# Patient Record
Sex: Female | Born: 1975 | Race: Black or African American | Hispanic: No | Marital: Single | Smoking: Never smoker
Health system: Southern US, Community
[De-identification: ages and names within clinical notes are randomized; demographics above are authoritative.]

## PROBLEM LIST (undated history)

## (undated) DIAGNOSIS — Z5189 Encounter for other specified aftercare: Secondary | ICD-10-CM

## (undated) DIAGNOSIS — D649 Anemia, unspecified: Secondary | ICD-10-CM

## (undated) DIAGNOSIS — J45909 Unspecified asthma, uncomplicated: Secondary | ICD-10-CM

## (undated) HISTORY — PX: NO PAST SURGERIES: SHX2092

---

## 2005-03-06 ENCOUNTER — Emergency Department (HOSPITAL_COMMUNITY): Admission: EM | Admit: 2005-03-06 | Discharge: 2005-03-07 | Payer: Self-pay | Admitting: Emergency Medicine

## 2005-03-11 ENCOUNTER — Emergency Department (HOSPITAL_COMMUNITY): Admission: EM | Admit: 2005-03-11 | Discharge: 2005-03-12 | Payer: Self-pay | Admitting: Emergency Medicine

## 2005-11-11 ENCOUNTER — Emergency Department (HOSPITAL_COMMUNITY): Admission: EM | Admit: 2005-11-11 | Discharge: 2005-11-11 | Payer: Self-pay | Admitting: Emergency Medicine

## 2006-01-29 ENCOUNTER — Emergency Department (HOSPITAL_COMMUNITY): Admission: EM | Admit: 2006-01-29 | Discharge: 2006-01-29 | Payer: Self-pay | Admitting: Emergency Medicine

## 2006-07-09 ENCOUNTER — Emergency Department (HOSPITAL_COMMUNITY): Admission: EM | Admit: 2006-07-09 | Discharge: 2006-07-09 | Payer: Self-pay | Admitting: Emergency Medicine

## 2006-08-06 ENCOUNTER — Ambulatory Visit: Payer: Self-pay | Admitting: Internal Medicine

## 2006-11-12 ENCOUNTER — Emergency Department (HOSPITAL_COMMUNITY): Admission: EM | Admit: 2006-11-12 | Discharge: 2006-11-12 | Payer: Self-pay | Admitting: Emergency Medicine

## 2006-11-13 ENCOUNTER — Ambulatory Visit: Payer: Self-pay | Admitting: *Deleted

## 2007-05-06 ENCOUNTER — Emergency Department (HOSPITAL_COMMUNITY): Admission: EM | Admit: 2007-05-06 | Discharge: 2007-05-06 | Payer: Self-pay | Admitting: Emergency Medicine

## 2007-09-13 ENCOUNTER — Emergency Department (HOSPITAL_COMMUNITY): Admission: EM | Admit: 2007-09-13 | Discharge: 2007-09-13 | Payer: Self-pay | Admitting: Emergency Medicine

## 2007-10-21 ENCOUNTER — Emergency Department (HOSPITAL_COMMUNITY): Admission: EM | Admit: 2007-10-21 | Discharge: 2007-10-22 | Payer: Self-pay | Admitting: Emergency Medicine

## 2011-01-03 ENCOUNTER — Emergency Department (HOSPITAL_COMMUNITY)
Admission: EM | Admit: 2011-01-03 | Discharge: 2011-01-03 | Disposition: A | Discharge status: Home / Self Care | Payer: Worker's Compensation | Attending: Emergency Medicine | Admitting: Emergency Medicine

## 2011-01-03 DIAGNOSIS — R51 Headache: Secondary | ICD-10-CM | POA: Insufficient documentation

## 2011-01-03 DIAGNOSIS — H538 Other visual disturbances: Secondary | ICD-10-CM | POA: Insufficient documentation

## 2011-01-03 DIAGNOSIS — Y990 Civilian activity done for income or pay: Secondary | ICD-10-CM | POA: Insufficient documentation

## 2011-01-03 DIAGNOSIS — S0990XA Unspecified injury of head, initial encounter: Secondary | ICD-10-CM | POA: Insufficient documentation

## 2011-01-03 DIAGNOSIS — IMO0002 Reserved for concepts with insufficient information to code with codable children: Secondary | ICD-10-CM | POA: Insufficient documentation

## 2011-01-03 DIAGNOSIS — R42 Dizziness and giddiness: Secondary | ICD-10-CM | POA: Insufficient documentation

## 2011-01-18 ENCOUNTER — Other Ambulatory Visit: Payer: Self-pay | Admitting: Internal Medicine

## 2011-01-18 DIAGNOSIS — T1490XA Injury, unspecified, initial encounter: Secondary | ICD-10-CM

## 2011-01-22 ENCOUNTER — Other Ambulatory Visit: Payer: Worker's Compensation

## 2012-04-26 ENCOUNTER — Emergency Department (HOSPITAL_COMMUNITY): Payer: Self-pay

## 2012-04-26 ENCOUNTER — Encounter (HOSPITAL_COMMUNITY): Payer: Self-pay | Admitting: *Deleted

## 2012-04-26 ENCOUNTER — Emergency Department (HOSPITAL_COMMUNITY)
Admission: EM | Admit: 2012-04-26 | Discharge: 2012-04-27 | Disposition: A | Discharge status: Home / Self Care | Payer: Self-pay | Attending: Emergency Medicine | Admitting: Emergency Medicine

## 2012-04-26 DIAGNOSIS — J45901 Unspecified asthma with (acute) exacerbation: Secondary | ICD-10-CM | POA: Insufficient documentation

## 2012-04-26 DIAGNOSIS — R0602 Shortness of breath: Secondary | ICD-10-CM | POA: Insufficient documentation

## 2012-04-26 DIAGNOSIS — R059 Cough, unspecified: Secondary | ICD-10-CM | POA: Insufficient documentation

## 2012-04-26 DIAGNOSIS — R05 Cough: Secondary | ICD-10-CM | POA: Insufficient documentation

## 2012-04-26 HISTORY — DX: Unspecified asthma, uncomplicated: J45.909

## 2012-04-26 MED ORDER — ALBUTEROL (5 MG/ML) CONTINUOUS INHALATION SOLN
10.0000 mg/h | INHALATION_SOLUTION | RESPIRATORY_TRACT | Status: AC
  Administered 2012-04-26: via RESPIRATORY_TRACT
  Administered 2012-04-26: 10 mg/h via RESPIRATORY_TRACT

## 2012-04-26 MED ORDER — ALBUTEROL SULFATE (5 MG/ML) 0.5% IN NEBU
INHALATION_SOLUTION | RESPIRATORY_TRACT | Status: AC
  Filled 2012-04-26: qty 2

## 2012-04-26 MED ORDER — IPRATROPIUM BROMIDE 0.02 % IN SOLN
0.5000 mg | Freq: Once | RESPIRATORY_TRACT | Status: AC
  Administered 2012-04-26: 0.5 mg via RESPIRATORY_TRACT
  Filled 2012-04-26: qty 2.5

## 2012-04-26 MED ORDER — PREDNISONE 20 MG PO TABS
60.0000 mg | ORAL_TABLET | Freq: Once | ORAL | Status: AC
  Administered 2012-04-26: 60 mg via ORAL
  Filled 2012-04-26: qty 3

## 2012-04-26 MED ORDER — ALBUTEROL SULFATE (5 MG/ML) 0.5% IN NEBU
5.0000 mg | INHALATION_SOLUTION | Freq: Once | RESPIRATORY_TRACT | Status: AC
  Administered 2012-04-26: 5 mg via RESPIRATORY_TRACT
  Filled 2012-04-26: qty 1

## 2012-04-26 NOTE — ED Provider Notes (Signed)
History     CSN: [id number]  Arrival date & time 04/26/12  2031   First MD Initiated Contact with Patient 04/26/12 2230      Chief Complaint  Patient presents with  . Asthma  . Cough  . Shortness of Breath  . Wheezing    (Consider location/radiation/quality/duration/timing/severity/associated sxs/prior treatment) HPI Comments: Patient is a 36 year old female with a past medical history of asthma who presents with a 5 day history of SOB. Patient reports gradual onset and progressive worsening of symptoms since the onset. Patient works outside and believes the weather and dust particles at work are triggering her asthma. No alleviating factors. Patient has tried robitussin and her friend's nebulizer which have not helped. Patient reports an asthma attack 1 month ago. Associated symptoms include wheezing and non productive cough.   Patient is a 36 y.o. female presenting with asthma, cough, shortness of breath, and wheezing.  Asthma Associated symptoms include coughing.  Cough Associated symptoms include shortness of breath and wheezing. Her past medical history is significant for asthma.  Shortness of Breath  Associated symptoms include cough, shortness of breath and wheezing. Her past medical history is significant for asthma.  Wheezing  Associated symptoms include cough, shortness of breath and wheezing. Her past medical history is significant for asthma.    Past Medical History  Diagnosis Date  . Asthma     History reviewed. No pertinent past surgical history.  No family history on file.  History  Substance Use Topics  . Smoking status: Never Smoker   . Smokeless tobacco: Not on file  . Alcohol Use: No    OB History    Grav Para Term Preterm Abortions TAB SAB Ect Mult Living                  Review of Systems  Respiratory: Positive for cough, shortness of breath and wheezing.   All other systems reviewed and are negative.    Allergies  Codeine  Home  Medications   Current Outpatient Rx  Name  Route  Sig  Dispense  Refill  . GUAIFENESIN 100 MG/5ML PO LIQD   Oral   Take 200 mg by mouth 3 (three) times daily as needed. For cough           BP 143/78  Pulse 102  Temp 98.9 F (37.2 C) (Oral)  Resp 20  SpO2 100%  LMP 03/18/2012  Physical Exam  Nursing note and vitals reviewed. Constitutional: She is oriented to person, place, and time. She appears well-developed and well-nourished. No distress.  HENT:  Head: Normocephalic and atraumatic.  Mouth/Throat: Oropharynx is clear and moist. No oropharyngeal exudate.  Eyes: Conjunctivae normal and EOM are normal. Pupils are equal, round, and reactive to light. No scleral icterus.  Neck: Normal range of motion. Neck supple.  Cardiovascular: Normal rate and regular rhythm.  Exam reveals no gallop and no friction rub.   No murmur heard. Pulmonary/Chest: Effort normal. She has wheezes. She has no rales. She exhibits no tenderness.       Wheezing noted throughout bilateral lung fields.   Abdominal: Soft. She exhibits no distension. There is no tenderness. There is no rebound and no guarding.  Musculoskeletal: Normal range of motion.  Neurological: She is alert and oriented to person, place, and time. Coordination normal.       Speech is goal-oriented. Moves limbs without ataxia.   Skin: Skin is warm and dry. She is not diaphoretic.  Psychiatric: She has  a normal mood and affect. Her behavior is normal.    ED Course  Procedures (including critical care time)  Labs Reviewed - No data to display Dg Chest 2 View  04/26/2012  *RADIOLOGY REPORT*  Clinical Data: Asthma, cough, wheezing and shortness of breath.  CHEST - 2 VIEW  Comparison: 05/06/2007  Findings: No edema, infiltrate or pleural fluid is seen.  Lung volumes are normal.  No pneumothorax identified.  The heart size is at the upper limits of normal and slightly more prominent compared to the prior chest x-ray in 2009.  No bony  abnormalities are identified.  IMPRESSION: Increased prominence of heart size since 2009.   Original Report Authenticated By: Irish Lack, M.D.      1. Asthma exacerbation       MDM  10:57 PM Patient will have prednisone, atrovent nebulizer and albuterol nebulizer. Chest xray unremarkable.   12:58 AM Patient feels better and breathing has improved. Patient will be discharged with prednisone, albuterol inhaler, and dextromethorphan. Patient will be discharged without further evaluation. Vitals stable.      Emilia Beck, PA-C 04/27/12 [phone]

## 2012-04-26 NOTE — ED Notes (Signed)
Notified respiratory of continuous neb

## 2012-04-26 NOTE — ED Notes (Addendum)
C/o sob, cough, wheezing, onset Monday. Mentions vomiting Wednesday. persistant constant cough in triage. Last attack ~ 1 month ago. Never hospitalized for same. Last prednisone and CXR was yrs ago. Works outside. Recent weather has triggered sx. Denies fever. Has borrowed breathing machine, has been using albuterol, also robitussin. Robitussin in am, both at night. Last albuterol was Friday night.

## 2012-04-27 ENCOUNTER — Encounter (HOSPITAL_COMMUNITY): Payer: Self-pay | Admitting: Emergency Medicine

## 2012-04-27 MED ORDER — DEXTROMETHORPHAN POLISTIREX 30 MG/5ML PO LQCR
10.0000 mL | Freq: Once | ORAL | Status: AC
  Administered 2012-04-27: 60 mg via ORAL
  Filled 2012-04-27: qty 10

## 2012-04-27 MED ORDER — DEXTROMETHORPHAN HBR 15 MG/5ML PO SYRP: 10.0000 mL | ORAL_SOLUTION | Freq: Four times a day (QID) | ORAL | Status: DC | PRN

## 2012-04-27 MED ORDER — PREDNISONE 20 MG PO TABS: 40.0000 mg | ORAL_TABLET | Freq: Every day | ORAL | Status: DC

## 2012-04-27 MED ORDER — ALBUTEROL SULFATE HFA 108 (90 BASE) MCG/ACT IN AERS: 1.0000 | INHALATION_SPRAY | Freq: Four times a day (QID) | RESPIRATORY_TRACT | Status: DC | PRN

## 2012-05-15 NOTE — ED Provider Notes (Signed)
Medical screening examination/treatment/procedure(s) were performed by non-physician practitioner and as supervising physician I was immediately available for consultation/collaboration.   Loren Racer, MD 05/15/12 [phone]

## 2013-02-24 ENCOUNTER — Emergency Department (HOSPITAL_COMMUNITY)
Admission: EM | Admit: 2013-02-24 | Discharge: 2013-02-24 | Discharge status: Against Medical Advice | Payer: PRIVATE HEALTH INSURANCE | Attending: Emergency Medicine | Admitting: Emergency Medicine

## 2013-02-24 ENCOUNTER — Encounter (HOSPITAL_COMMUNITY): Payer: Self-pay | Admitting: Emergency Medicine

## 2013-02-24 DIAGNOSIS — J45901 Unspecified asthma with (acute) exacerbation: Secondary | ICD-10-CM | POA: Insufficient documentation

## 2013-02-24 DIAGNOSIS — Z3202 Encounter for pregnancy test, result negative: Secondary | ICD-10-CM | POA: Insufficient documentation

## 2013-02-24 LAB — URINALYSIS, ROUTINE W REFLEX MICROSCOPIC
Hgb urine dipstick: NEGATIVE
Specific Gravity, Urine: 1.023 (ref 1.005–1.030)
Urobilinogen, UA: 1 mg/dL (ref 0.0–1.0)

## 2013-02-24 LAB — URINE MICROSCOPIC-ADD ON

## 2013-02-24 LAB — POCT PREGNANCY, URINE: Preg Test, Ur: NEGATIVE

## 2013-02-24 NOTE — ED Notes (Signed)
Presents with nausea, frequent urination and lack of period since February. Denies abdominal pain , denies pain anywhere at this time.  Pt states, "I am just tired and I just want to go sleep all the time, I feel movement in my belly and I have not had a period. I think I am pregnant"

## 2013-02-24 NOTE — ED Notes (Signed)
No answer, unable to find. 

## 2013-02-24 NOTE — ED Notes (Signed)
Wait explained, reassured, hope given. General announcement to w/r.

## 2013-02-24 NOTE — ED Notes (Signed)
Wait plan and process explained with rationale.

## 2013-02-24 NOTE — ED Notes (Signed)
Wait, plan and process explained with rationale.

## 2013-02-24 NOTE — ED Notes (Signed)
Attempted to call x2 for pt being moved to room.

## 2013-02-27 LAB — URINE CULTURE

## 2013-02-28 NOTE — Progress Notes (Signed)
ED Antimicrobial Stewardship Positive Culture Follow Up   Giamarie Bueche is an 37 y.o. female who presented to [facility] on 02/24/2013 with a chief complaint of nausea, frequent urination  Chief Complaint  Patient presents with  . Possible Pregnancy    Recent Results (from the past 720 hour(s))  URINE CULTURE     Status: None   Collection Time    02/24/13  6:12 PM      Result Value Range Status   Specimen Description URINE, CLEAN CATCH   Final   Special Requests NONE   Final   Culture  Setup Time     Final   Value: 02/24/2013 20:48     Performed at [facility] Count     Final   Value: >=100,000 COLONIES/ML     Performed at [facility]   Culture     Final   Value: ESCHERICHIA COLI     Performed at [facility]   Report Status 02/27/2013 FINAL   Final   Organism ID, Bacteria ESCHERICHIA COLI   Final    [x]  Patient discharged originally without antimicrobial agent and treatment is now indicated  New antibiotic prescription: Amoxicillin 500 mg bid x 10 days  ED Provider: Elpidio Anis, PA-C  Rolley Sims 02/28/2013, 1:27 PM Infectious Diseases Pharmacist Phone# [phone]

## 2013-03-02 ENCOUNTER — Encounter (HOSPITAL_COMMUNITY): Payer: Self-pay | Admitting: Emergency Medicine

## 2013-03-02 ENCOUNTER — Emergency Department (HOSPITAL_COMMUNITY)
Admission: EM | Admit: 2013-03-02 | Discharge: 2013-03-02 | Disposition: A | Discharge status: Home / Self Care | Payer: PRIVATE HEALTH INSURANCE | Attending: Emergency Medicine | Admitting: Emergency Medicine

## 2013-03-02 DIAGNOSIS — J45909 Unspecified asthma, uncomplicated: Secondary | ICD-10-CM | POA: Insufficient documentation

## 2013-03-02 DIAGNOSIS — Z862 Personal history of diseases of the blood and blood-forming organs and certain disorders involving the immune mechanism: Secondary | ICD-10-CM | POA: Insufficient documentation

## 2013-03-02 DIAGNOSIS — Z79899 Other long term (current) drug therapy: Secondary | ICD-10-CM | POA: Insufficient documentation

## 2013-03-02 DIAGNOSIS — N390 Urinary tract infection, site not specified: Secondary | ICD-10-CM | POA: Insufficient documentation

## 2013-03-02 HISTORY — DX: Anemia, unspecified: D64.9

## 2013-03-02 LAB — URINE MICROSCOPIC-ADD ON

## 2013-03-02 LAB — URINALYSIS, ROUTINE W REFLEX MICROSCOPIC
Ketones, ur: NEGATIVE mg/dL
Leukocytes, UA: NEGATIVE
Nitrite: NEGATIVE
Protein, ur: NEGATIVE mg/dL
Urobilinogen, UA: 0.2 mg/dL (ref 0.0–1.0)

## 2013-03-02 MED ORDER — CEPHALEXIN 500 MG PO CAPS: 500.0000 mg | ORAL_CAPSULE | Freq: Four times a day (QID) | ORAL | Status: DC

## 2013-03-02 NOTE — ED Notes (Signed)
Attempt made to contact patient to notify her that needs to treated due urine culture coming back positive.Patient returned call and was notified to follow up with PCP.

## 2013-03-02 NOTE — ED Provider Notes (Signed)
Medical screening examination/treatment/procedure(s) were performed by non-physician practitioner and as supervising physician I was immediately available for consultation/collaboration.  EKG Interpretation   None         Richardean Canal, MD 03/02/13 1929

## 2013-03-02 NOTE — ED Notes (Signed)
Pt was here last week for pregnancy test and had to leave prior to MD eval. States she was called today and told to return for e coli in urine on UA.

## 2013-03-02 NOTE — ED Provider Notes (Signed)
CSN: [id number]     Arrival date & time 03/02/13  1650 History   First MD Initiated Contact with Patient 03/02/13 1809     Chief Complaint  Patient presents with  . Urinary Tract Infection   (Consider location/radiation/quality/duration/timing/severity/associated sxs/prior Treatment) HPI Comments: Patient is a 37 year old female who presents to the emergency department after being called back to return for treatment for a urinary tract infection. Patient states she received a call today to return as her urine culture from October 28 showed Escherichia coli. She had frequent urination at that time, left prior to being seen. Urine pregnancy resulted negative. Currently she is denying any complaints. Denies abdominal pain, increased urinary frequency, urgency or dysuria. Currently on her menses.  The history is provided by the patient and medical records.    Past Medical History  Diagnosis Date  . Asthma   . Anemia    History reviewed. No pertinent past surgical history. History reviewed. No pertinent family history. History  Substance Use Topics  . Smoking status: Never Smoker   . Smokeless tobacco: Not on file  . Alcohol Use: No   OB History   Grav Para Term Preterm Abortions TAB SAB Ect Mult Living                 Review of Systems  All other systems reviewed and are negative.    Allergies  Codeine  Home Medications   Current Outpatient Rx  Name  Route  Sig  Dispense  Refill  . albuterol (PROVENTIL HFA;VENTOLIN HFA) 108 (90 BASE) MCG/ACT inhaler   Inhalation   Inhale 1-2 puffs into the lungs every 6 (six) hours as needed for wheezing.   1 Inhaler   3    BP 110/66  Pulse 103  Temp(Src) 98.8 F (37.1 C) (Oral)  Resp 18  SpO2 98%  LMP 06/27/2012 Physical Exam  Nursing note and vitals reviewed. Constitutional: She is oriented to person, place, and time. She appears well-developed and well-nourished. No distress.  HENT:  Head: Normocephalic and atraumatic.   Mouth/Throat: Oropharynx is clear and moist.  Eyes: Conjunctivae are normal.  Neck: Normal range of motion. Neck supple.  Cardiovascular: Normal rate, regular rhythm and normal heart sounds.   No tachycardia at time of examination.  Pulmonary/Chest: Effort normal and breath sounds normal.  Abdominal: Soft. Bowel sounds are normal. There is tenderness (mild) in the suprapubic area.  Musculoskeletal: Normal range of motion. She exhibits no edema.  Neurological: She is alert and oriented to person, place, and time.  Skin: Skin is warm and dry. She is not diaphoretic.  Psychiatric: She has a normal mood and affect. Her behavior is normal.    ED Course  Procedures (including critical care time) Labs Review Labs Reviewed  URINALYSIS, ROUTINE W REFLEX MICROSCOPIC - Abnormal; Notable for the following:    APPearance CLOUDY (*)    Hgb urine dipstick LARGE (*)    All other components within normal limits  URINE MICROSCOPIC-ADD ON - Abnormal; Notable for the following:    Squamous Epithelial / LPF FEW (*)    Bacteria, UA FEW (*)    All other components within normal limits  URINE CULTURE   Imaging Review No results found.  EKG Interpretation   None       MDM   1. Urinary tract infection    Tx with keflex. F/u with PCP. Return precautions given. Well appearing, NAD. Patient states understanding of treatment care plan and is agreeable.  Trevor Mace, PA-C 03/02/13 1925

## 2013-03-05 LAB — URINE CULTURE

## 2013-06-11 ENCOUNTER — Inpatient Hospital Stay (HOSPITAL_COMMUNITY)
Admission: EM | Admit: 2013-06-11 | Discharge: 2013-06-13 | DRG: 812 | Disposition: A | Discharge status: Home / Self Care | Payer: No Typology Code available for payment source | Attending: Internal Medicine | Admitting: Internal Medicine

## 2013-06-11 ENCOUNTER — Encounter (HOSPITAL_COMMUNITY): Payer: Self-pay | Admitting: Emergency Medicine

## 2013-06-11 DIAGNOSIS — D500 Iron deficiency anemia secondary to blood loss (chronic): Principal | ICD-10-CM

## 2013-06-11 DIAGNOSIS — D518 Other vitamin B12 deficiency anemias: Secondary | ICD-10-CM | POA: Diagnosis present

## 2013-06-11 DIAGNOSIS — N920 Excessive and frequent menstruation with regular cycle: Secondary | ICD-10-CM

## 2013-06-11 DIAGNOSIS — E876 Hypokalemia: Secondary | ICD-10-CM

## 2013-06-11 DIAGNOSIS — Z888 Allergy status to other drugs, medicaments and biological substances status: Secondary | ICD-10-CM

## 2013-06-11 DIAGNOSIS — N926 Irregular menstruation, unspecified: Secondary | ICD-10-CM | POA: Diagnosis present

## 2013-06-11 DIAGNOSIS — D649 Anemia, unspecified: Secondary | ICD-10-CM

## 2013-06-11 DIAGNOSIS — D519 Vitamin B12 deficiency anemia, unspecified: Secondary | ICD-10-CM

## 2013-06-11 DIAGNOSIS — D509 Iron deficiency anemia, unspecified: Secondary | ICD-10-CM

## 2013-06-11 DIAGNOSIS — J45909 Unspecified asthma, uncomplicated: Secondary | ICD-10-CM | POA: Diagnosis present

## 2013-06-11 DIAGNOSIS — Z885 Allergy status to narcotic agent status: Secondary | ICD-10-CM

## 2013-06-11 DIAGNOSIS — R609 Edema, unspecified: Secondary | ICD-10-CM | POA: Diagnosis present

## 2013-06-11 HISTORY — DX: Excessive and frequent menstruation with regular cycle: N92.0

## 2013-06-11 LAB — PROTIME-INR
INR: 1.01 (ref 0.00–1.49)
Prothrombin Time: 13.1 seconds (ref 11.6–15.2)

## 2013-06-11 LAB — COMPREHENSIVE METABOLIC PANEL
ALBUMIN: 3.6 g/dL (ref 3.5–5.2)
ALK PHOS: 71 U/L (ref 39–117)
ALT: 10 U/L (ref 0–35)
AST: 15 U/L (ref 0–37)
BUN: 10 mg/dL (ref 6–23)
CHLORIDE: 105 meq/L (ref 96–112)
CO2: 24 mEq/L (ref 19–32)
Calcium: 9.1 mg/dL (ref 8.4–10.5)
Creatinine, Ser: 0.55 mg/dL (ref 0.50–1.10)
GFR calc non Af Amer: >90 mL/min (ref >90)
GLUCOSE: 99 mg/dL (ref 70–99)
POTASSIUM: 3.5 meq/L — AB (ref 3.7–5.3)
SODIUM: 142 meq/L (ref 137–147)
TOTAL PROTEIN: 7.3 g/dL (ref 6.0–8.3)

## 2013-06-11 LAB — CBC WITH DIFFERENTIAL/PLATELET
BASOS ABS: 0 10*3/uL (ref 0.0–0.1)
Basophils Relative: 1 % (ref 0–1)
EOS PCT: 1 % (ref 0–5)
Eosinophils Absolute: 0 10*3/uL (ref 0.0–0.7)
HEMATOCRIT: 16.6 % — AB (ref 36.0–46.0)
HEMOGLOBIN: 4.3 g/dL — AB (ref 12.0–15.0)
LYMPHS PCT: 32 % (ref 12–46)
Lymphs Abs: 1.4 10*3/uL (ref 0.7–4.0)
MCH: 13.5 pg — ABNORMAL LOW (ref 26.0–34.0)
MCHC: 25.5 g/dL — ABNORMAL LOW (ref 30.0–36.0)
MCV: 53.1 fL — AB (ref 78.0–100.0)
MONOS PCT: 7 % (ref 3–12)
Monocytes Absolute: 0.3 10*3/uL (ref 0.1–1.0)
NEUTROS ABS: 2.7 10*3/uL (ref 1.7–7.7)
Neutrophils Relative %: 59 % (ref 43–77)
Platelets: 359 10*3/uL (ref 150–400)
RBC: 3.11 MIL/uL — AB (ref 3.87–5.11)
RDW: 28.4 % — AB (ref 11.5–15.5)
WBC: 4.4 10*3/uL (ref 4.0–10.5)

## 2013-06-11 LAB — ABO/RH: ABO/RH(D): B POS

## 2013-06-11 LAB — PREPARE RBC (CROSSMATCH)

## 2013-06-11 LAB — OCCULT BLOOD, POC DEVICE: FECAL OCCULT BLD: NEGATIVE

## 2013-06-11 MED ORDER — ACETAMINOPHEN 325 MG PO TABS: 650.0000 mg | ORAL_TABLET | Freq: Four times a day (QID) | ORAL | Status: DC | PRN

## 2013-06-11 MED ORDER — POTASSIUM CHLORIDE CRYS ER 20 MEQ PO TBCR
40.0000 meq | EXTENDED_RELEASE_TABLET | Freq: Once | ORAL | Status: AC
  Administered 2013-06-11: 40 meq via ORAL
  Filled 2013-06-11: qty 2

## 2013-06-11 MED ORDER — ACETAMINOPHEN 650 MG RE SUPP: 650.0000 mg | Freq: Four times a day (QID) | RECTAL | Status: DC | PRN

## 2013-06-11 NOTE — H&P (Signed)
[facility] History and Physical  Rebecca Payne HAL:[id number] DOB: 1975/11/28 DOA: 06/11/2013  Referring physician: Dr. Verner Chol PCP: Salena Saner., MD   Chief Complaint:  Sent from PCP office for low hemoglobin  HPI:  38 year old female with past medical history of anemia (was on iron supplements and stopped taking it about 2 years back) had gone to see her PCP for symptoms of leg swellings for 2 weeks. Patient also reports losing about 10-15 pounds for past 6 weeks which she is is intentional and has been eating poorly due to job stress. Patient went to see her PCP for this and had blood work done which showed low hemoglobin and patient was asked to come to the ED. Patient reports having heavy menstrual cycles over the years, the previous year she had longer menstrual cycles lasting almost for a week with blood clots. For the last 6 months her menstrual bleeding lasts for 4 days but are heavy with passage of clots and changes at least 3 pads in a day. Her leg swelling subsided a few days ago. Patient denies headache, dizziness, fever, chills, nausea , vomiting, chest pain, palpitations, SOB, abdominal pain, bowel or urinary symptoms. Denies any weakness, fatigue or dyspnea on exertion. Denies any blood in stool or change in consistency of the stool. Denies any hemoptysis or hematemesis. Denies change in appetite. She denies taking any medications. She does report history of anemia and being on iron supplements in the past but has not taken any for past 2 years.   Course in the ED Patient's vitals were stable. Blood work done toward RBC of 3.1, hemoglobin of 4.3 and hematocrit of 16.6. MCV was 53. Chemistry showed potassium of 2.5. Patient admitted under observation. 2 U PRBC ordered by ED physician.  Review of Systems:  Constitutional: Denies fever, chills, diaphoresis, appetite change and fatigue.  HEENT: Denies photophobia, ear pain, congestion, sore throat, rhinorrhea,  sneezing, mouth sores, trouble swallowing, neck pain, neck stiffness and tinnitus.   Respiratory: Denies SOB, DOE, cough, chest tightness,  and wheezing.   Cardiovascular: Denies chest pain, palpitations and leg swelling.  Gastrointestinal: Denies nausea, vomiting, abdominal pain, diarrhea, constipation, blood in stool and abdominal distention.  Genitourinary: Denies dysuria, urgency, frequency, hematuria, flank pain and difficulty urinating.  Endocrine: Denies  hot or cold intolerance, sweats, changes in hair or nails, polyuria, polydipsia. Musculoskeletal: Denies myalgias, back pain, joint swelling, arthralgias and gait problem.  Skin: Denies pallor, rash and wound.  Neurological: Denies dizziness, seizures, syncope, weakness, light-headedness, numbness and headaches.  Hematological: Denies adenopathy. Hx of bleeding Psychiatric/Behavioral: Denies  mood changes, confusion, nervousness, sleep disturbance and agitation   Past Medical History  Diagnosis Date  . Asthma   . Anemia    Past Surgical History  Procedure Laterality Date  . No past surgeries     Social History:  reports that she has never smoked. She has never used smokeless tobacco. She reports that she does not drink alcohol or use illicit drugs.  Allergies  Allergen Reactions  . Codeine Swelling  . Lasix [Furosemide] Nausea Only    "took with potassium and had an episode with her vision "    History reviewed. No pertinent family history.  Prior to Admission medications   Not on File     Physical Exam:  Filed Vitals:   06/11/13 1634 06/11/13 1806 06/11/13 1808  BP: 150/64 117/78   Pulse: 96  94  Temp: 98.2 F (36.8 C)    TempSrc: Oral  Resp: 18  18  SpO2: 100%  100%    Constitutional: Vital signs reviewed.  Patient is a middle aged female lying in bed in no acute distress  HEENT:pallor present, no icterus, moist oral mucosa, no cervical lymphadenopathy Cardiovascular: RRR, S1 normal, S2 normal, no  MRG Chest: CTAB, no wheezes, rales, or rhonchi Abdominal: Soft. Non-tender, non-distended, bowel sounds are normal, no masses, organomegaly, or guarding present.  Ext: warm, no edema Neurological: A&O x3, non focal  Labs on Admission:  Basic Metabolic Panel:  Recent Labs Lab 06/11/13 1711  NA 142  K 3.5*  CL 105  CO2 24  GLUCOSE 99  BUN 10  CREATININE 0.55  CALCIUM 9.1   Liver Function Tests:  Recent Labs Lab 06/11/13 1711  AST 15  ALT 10  ALKPHOS 71  BILITOT <0.2*  PROT 7.3  ALBUMIN 3.6   No results found for this basename: LIPASE, AMYLASE,  in the last 168 hours No results found for this basename: AMMONIA,  in the last 168 hours CBC:  Recent Labs Lab 06/11/13 1711  WBC 4.4  NEUTROABS PENDING  HGB 4.3*  HCT 16.6*  MCV 53.1*  PLT 359   Cardiac Enzymes: No results found for this basename: CKTOTAL, CKMB, CKMBINDEX, TROPONINI,  in the last 168 hours BNP: No components found with this basename: POCBNP,  CBG: No results found for this basename: GLUCAP,  in the last 168 hours  Radiological Exams on Admission: No results found.    Assessment/Plan   Principal Problem:   Microcytic anemia Like in the setting of iron deficiency anemia from heavy a slow bleed. Follow full CBC differential, iron panel, TSH and vitamin B12 level. 2 units PRBC ordered from ED pediatric monitor H&H following transfusion. -Check peripheral smear. Does report 10-15 pounds weight loss over 6 weeks. Stool for occult blood negative. She reports this to be intentional . No constitutional symptoms. -Depending up on iron panel results she will need iron supplements upon discharge. Will likely need IV iron during hospitalization.  Hypokalemia Replenish  Diet:cardiac  DVT prophylaxis: SCDs   Code Status: full code Family Communication: Cousin at bedside Disposition Plan: home in am if H&H improved with transfusion  Louellen Molder [facility] Pager [phone]  Total  time spent on admission :50 minutes  If 7PM-7AM, please contact night-coverage [email or web address] Password [facility] 06/11/2013, 6:48 PM

## 2013-06-11 NOTE — ED Notes (Addendum)
Pt reports being contacted by her PCP to report to the ED due to low hemoglobin. Pt reports going to her PCP due to bilateral leg swelling one week ago, which she followed up yesterday and had some follow up labs taken. Pt reports that they did not inform her of how low the level was. Pt reports a history of anemia. Pt is A/O x4, denies shortness of breath, speaking in full sentences, vitals are WDL, has an oxygen saturation of 100% on room air, and pt is in NAD.

## 2013-06-11 NOTE — ED Provider Notes (Signed)
CSN: [id number]     Arrival date & time 06/11/13  1626 History   First MD Initiated Contact with Patient 06/11/13 1642     Chief Complaint  Patient presents with  . Abnormal Lab     (Consider location/radiation/quality/duration/timing/severity/associated sxs/prior Treatment) HPI Comments: 38 year old female presents to the ER with a diagnosis of anemia. She states that she had labs drawn at her PCPs office and they called today to tell her that she come to the ER for transfusion. She does not know what her hemoglobin level was. She states she has chronic anemia but does not know what her normal hemoglobin is. She denies blood in her stools, black stools, vomiting, abdominal pain, or heavy periods. Denies any shortness of breath or chest pain. She has had bilateral leg swelling over the past 2 weeks and this is why she went to her PCP's yesterday. She reports she had tests done for that which did not show heart failure or other concerning lab abnormalities besides the anemia. She states the leg swelling has resolved since yesterday.   Past Medical History  Diagnosis Date  . Asthma   . Anemia    History reviewed. No pertinent past surgical history. No family history on file. History  Substance Use Topics  . Smoking status: Never Smoker   . Smokeless tobacco: Not on file  . Alcohol Use: No   OB History   Grav Para Term Preterm Abortions TAB SAB Ect Mult Living                 Review of Systems  Constitutional: Negative for fever and fatigue.  Respiratory: Negative for cough and shortness of breath.   Cardiovascular: Positive for leg swelling (now resolved). Negative for chest pain.  Gastrointestinal: Negative for vomiting, abdominal pain and blood in stool.  Neurological: Negative for weakness.  All other systems reviewed and are negative.      Allergies  Codeine  Home Medications  No current outpatient prescriptions on file. BP 150/64  Pulse 96  Temp(Src) 98.2 F (36.8  C) (Oral)  Resp 18  SpO2 100%  LMP 06/04/2013 Physical Exam  Nursing note and vitals reviewed. Constitutional: She is oriented to person, place, and time. She appears well-developed and well-nourished.  HENT:  Head: Normocephalic and atraumatic.  Right Ear: External ear normal.  Left Ear: External ear normal.  Nose: Nose normal.  Eyes: Right eye exhibits no discharge. Left eye exhibits no discharge.  Pale conjunctivae bilaterally  Cardiovascular: Normal rate, regular rhythm and normal heart sounds.   Pulmonary/Chest: Effort normal and breath sounds normal.  Abdominal: Soft. She exhibits no distension. There is no tenderness.  Neurological: She is alert and oriented to person, place, and time.  Skin: Skin is warm and dry.    ED Course  Procedures (including critical care time) Labs Review Labs Reviewed  CBC WITH DIFFERENTIAL - Abnormal; Notable for the following:    RBC 3.11 (*)    Hemoglobin 4.3 (*)    HCT 16.6 (*)    MCV 53.1 (*)    MCH 13.5 (*)    MCHC 25.5 (*)    RDW 28.4 (*)    All other components within normal limits  COMPREHENSIVE METABOLIC PANEL - Abnormal; Notable for the following:    Potassium 3.5 (*)    Total Bilirubin <0.2 (*)    All other components within normal limits  PROTIME-INR  OCCULT BLOOD, POC DEVICE  TYPE AND SCREEN  PREPARE RBC (CROSSMATCH)  ABO/RH  Imaging Review No results found.  EKG Interpretation   None       MDM   Final diagnoses:  Anemia    Patient with asymptomatic but severe anemia. VSS. Likely acute on chronic given her history of anemia. Consented for blood transfusion by me, will give 2 units and admit to hospitalist.     Ephraim Hamburger, MD 06/11/13 [phone]

## 2013-06-11 NOTE — ED Notes (Signed)
Report given to nurse on floor. Will send pt up as soon as blood is started

## 2013-06-11 NOTE — ED Notes (Signed)
MD at bedside. 

## 2013-06-12 DIAGNOSIS — I059 Rheumatic mitral valve disease, unspecified: Secondary | ICD-10-CM

## 2013-06-12 DIAGNOSIS — N920 Excessive and frequent menstruation with regular cycle: Secondary | ICD-10-CM

## 2013-06-12 DIAGNOSIS — D519 Vitamin B12 deficiency anemia, unspecified: Secondary | ICD-10-CM

## 2013-06-12 DIAGNOSIS — D500 Iron deficiency anemia secondary to blood loss (chronic): Principal | ICD-10-CM

## 2013-06-12 LAB — IRON AND TIBC
Iron: 345 ug/dL — ABNORMAL HIGH (ref 42–135)
SATURATION RATIOS: 85 % — AB (ref 20–55)
TIBC: 405 ug/dL (ref 250–470)
UIBC: 60 ug/dL — ABNORMAL LOW (ref 125–400)

## 2013-06-12 LAB — TECHNOLOGIST SMEAR REVIEW

## 2013-06-12 LAB — CBC
HEMATOCRIT: 21.4 % — AB (ref 36.0–46.0)
Hemoglobin: 6 g/dL — CL (ref 12.0–15.0)
MCH: 17.1 pg — ABNORMAL LOW (ref 26.0–34.0)
MCHC: 28 g/dL — AB (ref 30.0–36.0)
MCV: 61.1 fL — ABNORMAL LOW (ref 78.0–100.0)
Platelets: 292 10*3/uL (ref 150–400)
RBC: 3.5 MIL/uL — AB (ref 3.87–5.11)
RDW: 35.8 % — ABNORMAL HIGH (ref 11.5–15.5)
WBC: 3.6 10*3/uL — ABNORMAL LOW (ref 4.0–10.5)

## 2013-06-12 LAB — VITAMIN B12: Vitamin B-12: 269 pg/mL (ref 211–911)

## 2013-06-12 LAB — TSH: TSH: 3.04 u[IU]/mL (ref 0.350–4.500)

## 2013-06-12 LAB — PREPARE RBC (CROSSMATCH)

## 2013-06-12 LAB — SAVE SMEAR

## 2013-06-12 LAB — HEMOGLOBIN AND HEMATOCRIT, BLOOD
HEMATOCRIT: 28 % — AB (ref 36.0–46.0)
HEMOGLOBIN: 8.4 g/dL — AB (ref 12.0–15.0)

## 2013-06-12 LAB — FERRITIN

## 2013-06-12 LAB — APTT: aPTT: 27 seconds (ref 24–37)

## 2013-06-12 MED ORDER — VITAMIN B-12 1000 MCG PO TABS
1000.0000 ug | ORAL_TABLET | Freq: Every day | ORAL | Status: DC
  Administered 2013-06-12 – 2013-06-13 (×2): 1000 ug via ORAL
  Filled 2013-06-12 (×2): qty 1

## 2013-06-12 MED ORDER — FERUMOXYTOL INJECTION 510 MG/17 ML
510.0000 mg | Freq: Once | INTRAVENOUS | Status: AC
  Administered 2013-06-12: 510 mg via INTRAVENOUS
  Filled 2013-06-12: qty 17

## 2013-06-12 MED ORDER — LEVONORGEST-ETH ESTRAD 91-DAY 0.15-0.03 &0.01 MG PO TABS: 1.0000 | ORAL_TABLET | Freq: Every day | ORAL | Status: DC

## 2013-06-12 MED ORDER — FERROUS SULFATE 325 (65 FE) MG PO TABS
325.0000 mg | ORAL_TABLET | Freq: Three times a day (TID) | ORAL | Status: DC
  Administered 2013-06-12 – 2013-06-13 (×2): 325 mg via ORAL
  Filled 2013-06-12 (×5): qty 1

## 2013-06-12 MED ORDER — FERROUS SULFATE 325 (65 FE) MG PO TABS: 325.0000 mg | ORAL_TABLET | Freq: Three times a day (TID) | ORAL | Status: DC

## 2013-06-12 MED ORDER — CYANOCOBALAMIN 1000 MCG/ML IJ SOLN
1000.0000 ug | Freq: Once | INTRAMUSCULAR | Status: AC
  Administered 2013-06-12: 1000 ug via INTRAMUSCULAR
  Filled 2013-06-12: qty 1

## 2013-06-12 MED ORDER — CYANOCOBALAMIN 1000 MCG PO TABS: 1000.0000 ug | ORAL_TABLET | Freq: Every day | ORAL | Status: DC

## 2013-06-12 NOTE — Progress Notes (Signed)
UR completed. Patient changed to inpatient- required PRBC for low hgb.

## 2013-06-12 NOTE — Progress Notes (Signed)
CRITICAL VALUE ALERT  Critical value received:  hgb 6.0  Date of notification:  06/12/13  Time of notification:  0645  Critical value read back:yes  Nurse who received alert:  Hale Bogus  MD notified (1st page):  Lamar Blinks, np  Time of first page:  [phone]  MD notified (2nd page):  Time of second page:  Responding MD: Raliegh Ip schorr  Time MD responded:  [phone]

## 2013-06-12 NOTE — Discharge Summary (Signed)
Physician Discharge Summary  Rebecca Payne ZOX:[id number] DOB: 08-28-1975 DOA: 06/11/2013  PCP: Salena Saner., MD  Admit date: 06/11/2013 Discharge date: 06/13/2013  Recommendations for Outpatient Follow-up:  1. Follow up with primary care doctor in 1 week for repeat CBC and follow up [facility] panel  Discharge Diagnoses:  Principal Problem:   Iron deficiency anemia due to chronic blood loss Active Problems:   Hypokalemia   Menorrhagia   Vitamin B12 deficiency anemia   Discharge Condition: stable, improved  Diet recommendation: regular  Wt Readings from Last 3 Encounters:  06/11/13 92.352 kg (203 lb 9.6 oz)  02/24/13 92.137 kg (203 lb 2 oz)    History of present illness:  38 year old female with past medical history of anemia (was on iron supplements and stopped taking it about 2 years back) had gone to see her PCP for symptoms of leg swellings for 2 weeks. Patient also reports losing about 10-15 pounds for past 6 weeks which she is is intentional and has been eating poorly due to job stress. Patient went to see her PCP for this and had blood work done which showed low hemoglobin and patient was asked to come to the ED. Patient reports having heavy menstrual cycles over the years, the previous year she had longer menstrual cycles lasting almost for a week with blood clots. For the last 6 months her menstrual bleeding lasts for 4 days but are heavy with passage of clots and changes at least 3 pads in a day. Her leg swelling subsided a few days ago.  Patient denies headache, dizziness, fever, chills, nausea , vomiting, chest pain, palpitations, SOB, abdominal pain, bowel or urinary symptoms. Denies any weakness, fatigue or dyspnea on exertion. Denies any blood in stool or change in consistency of the stool. Denies any hemoptysis or hematemesis. Denies change in appetite. She denies taking any medications. She does report history of anemia and being on iron supplements in the past  but has not taken any for past 2 years.    Hospital Course:   Iron deficiency and B12 deficiency anemia likely secondary to menorrhagia/metromenorrhagia.  Occult stool was negative.  Her initial hemoglobin was 4.3 and she received 4 units of packed red blood cells.  Her hemoglobin at discharge was 8.4mg /dl.  Her ferritin was less than 1. She received an infusion of IV iron and was started on 3 times daily iron supplements. Her vitamin B12 level was 269, so she was also started on vitamin B12 supplements for a goal  vitamin B12 level of greater than 400.  Her smear demonstrated some schistocytes and teardrop cells, but she had no atypical cells and there was not clinical concern for hemolysis nor did she have an elevated bilirubin.  Her platelets were normal so DIC or TTP are unlikely.     Menorrhagia/metromenorrhagia, Her mother also had anemia so von Willebrand factor panel was sent and is pending at the time of discharge.  Her coags and plateletes were normal.  She was given a prescription for Seasonique to help prevent further periods until she is recovering from her severe anemia.  She is advised to follow up with her PCP or gynecologist regarding further evaluation of her heavy and irregular periods.  Consider PCOS.    Lower extremity edema, concerning for development of heart failure from long-standing severe anemia. Her echocardiogram demonstrated EF of 55-60% with mild to mod MR and trivial pericardial effusion.  Hypokalemia, mild.  Given oral potassium supplementation.    Procedures:  PRBC  transfusion 4 units  Consultations:  None  Discharge Exam: Filed Vitals:   06/13/13 0645  BP: 119/77  Pulse: 76  Temp: 98.3 F (36.8 C)  Resp: 18   Filed Vitals:   06/12/13 1601 06/12/13 1658 06/12/13 2200 06/13/13 0645  BP: 117/80 112/76 136/88 119/77  Pulse: 82 71 73 76  Temp: 97.8 F (36.6 C) 97.4 F (36.3 C) 97.9 F (36.6 C) 98.3 F (36.8 C)  TempSrc: Oral Oral Oral Oral  Resp:  18 18 18 18   Height:      Weight:      SpO2: 100% 100% 100% 99%    General: AAF, NAD HEENT:  MMM, NCAT Cardiovascular: RRR, no mrg, 2+ pulses, warm extremities Respiratory: CTAB, no increased WOB ABD:  NABS, soft, ND/NT MSK:  No LEE, normal tone and bulk Neuro:  Grossly intact  Discharge Instructions      Discharge Orders   Future Orders Complete By Expires   Call MD for:  difficulty breathing, headache or visual disturbances  As directed    Call MD for:  extreme fatigue  As directed    Call MD for:  hives  As directed    Call MD for:  persistant dizziness or light-headedness  As directed    Call MD for:  persistant nausea and vomiting  As directed    Call MD for:  severe uncontrolled pain  As directed    Call MD for:  temperature >100.4  As directed    Diet general  As directed    Discharge instructions  As directed    Comments:     You were hospitalized with anemia and were transfused 4 units of blood.  You are iron and vitamin B12 deficient.  Please take vitamin B12 and iron supplements.  Your anemia may have come from your heavy periods, so please start birth control pills to decrease the frequency of your periods.  Your primary care doctor should repeat your bloodwork in about a week to make sure your blood counts are still okay.   Increase activity slowly  As directed        Medication List         cyanocobalamin 1000 MCG tablet  Take 1 tablet (1,000 mcg total) by mouth daily.     ferrous sulfate 325 (65 FE) MG tablet  Take 1 tablet (325 mg total) by mouth 3 (three) times daily with meals.     Levonorgestrel-Ethinyl Estradiol 0.15-0.03 &0.01 MG tablet  Commonly known as:  AMETHIA,CAMRESE  Take 1 tablet by mouth daily.       Follow-up Information   Follow up with Salena Saner., MD. Schedule an appointment as soon as possible for a visit in 1 week.   Specialty:  Internal Medicine   Contact information:   [address] [place]  [address] [phone]        The results of significant diagnostics from this hospitalization (including imaging, microbiology, ancillary and laboratory) are listed below for reference.    Significant Diagnostic Studies: No results found.  Microbiology: No results found for this or any previous visit (from the past 240 hour(s)).   Labs: Basic Metabolic Panel:  Recent Labs Lab 06/11/13 1711  NA 142  K 3.5*  CL 105  CO2 24  GLUCOSE 99  BUN 10  CREATININE 0.55  CALCIUM 9.1   Liver Function Tests:  Recent Labs Lab 06/11/13 1711  AST 15  ALT 10  ALKPHOS 71  BILITOT <0.2*  PROT 7.3  ALBUMIN 3.6   No results found for this basename: LIPASE, AMYLASE,  in the last 168 hours No results found for this basename: AMMONIA,  in the last 168 hours CBC:  Recent Labs Lab 06/11/13 1711 06/12/13 0535 06/12/13 1910  WBC 4.4 3.6*  --   NEUTROABS 2.7  --   --   HGB 4.3* 6.0* 8.4*  HCT 16.6* 21.4* 28.0*  MCV 53.1* 61.1*  --   PLT 359 292  --    Cardiac Enzymes: No results found for this basename: CKTOTAL, CKMB, CKMBINDEX, TROPONINI,  in the last 168 hours BNP: BNP (last 3 results) No results found for this basename: PROBNP,  in the last 8760 hours CBG: No results found for this basename: GLUCAP,  in the last 168 hours  Time coordinating discharge: 45 minutes  Signed:  Roark Rufo  [facility] 06/13/2013, 8:12 AM

## 2013-06-12 NOTE — Progress Notes (Signed)
Patient received 2 units of PRBC.  Patient tolerated them both well and denies any complaints.  Patient also received IV iron.  Pt tolerated IV iron well.

## 2013-06-12 NOTE — Progress Notes (Signed)
  Echocardiogram 2D Echocardiogram has been performed.  Rebecca Payne 06/12/2013, 9:40 AM

## 2013-06-12 NOTE — Progress Notes (Signed)
Nutrition Brief Note  Patient identified on the Malnutrition Screening Tool (MST) Report  Wt Readings from Last 15 Encounters:  06/11/13 203 lb 9.6 oz (92.352 kg)  02/24/13 203 lb 2 oz (92.137 kg)    Body mass index is 34.93 kg/(m^2). Patient meets criteria for class I obesity based on current BMI.    Pt with past medical history of anemia (was on iron supplements and stopped taking it about 2 years back) had gone to see her PCP for symptoms of leg swellings for 2 weeks. Patient also reports losing about 10-15 pounds for past 6 weeks which she is is intentional and has been eating poorly due to job stress. Patient went to see her PCP for this and had blood work done which showed low hemoglobin and patient was asked to come to the ED. Patient reports having heavy menstrual cycles over the years, the previous year she had longer menstrual cycles lasting almost for a week with blood clots. For the last 6 months her menstrual bleeding lasts for 4 days but are heavy with passage of clots and changes at least 3 pads in a day. Her leg swelling subsided a few days ago. Denies change in appetite. She does report history of anemia and being on iron supplements in the past but has not taken any for past 2 years. Pt with low hemoglobin of 4.3 g/dL on admission, has been getting transfusions.   Current diet order is regular, patient is consuming approximately 75% of meals at this time. Labs and medications reviewed. Potassium low, getting oral replacement. Met with pt who reports excellent appetite since admission and at home and reports 15 pound recent weight loss has been intentional. Pt had questions regarding iron rich foods that were answered. Provided handout on iron rich diet therapy and discussed sample meals, snacks, and food plans. RD contact information provided. Pt expressed understanding and appreciation.    No nutrition interventions warranted at this time. If nutrition issues arise, please consult  RD.   Mikey College MS, [place], [place] Pager [phone] After Hours Pager

## 2013-06-12 NOTE — Progress Notes (Signed)
[facility] PROGRESS NOTE  Rebecca Payne TDV:[id number] DOB: 1975/06/30 DOA: 06/11/2013 PCP: Salena Saner., MD  Assessment/Plan  Iron deficiency and B12 deficiency anemia likely secondary to menorrhagia/metromenorrhagia. Occult stool was negative. Her initial hemoglobin was 4.3 and she received 4 units of packed red blood cells. Her ferritin was less than 1. She received an infusion of IV iron and was started on 3 times daily iron supplements. Her vitamin B12 level was 269, so she was also started on vitamin B12 supplements for a goal vitamin B12 level of greater than 400. Her smear demonstrated some schistocytes and teardrop cells, but she had no atypical cells and there was not clinical concern for hemolysis nor did she have an elevated bilirubin. Her platelets were normal so DIC or TTP are unlikely.  -  F/u post-transfusion CBC  Menorrhagia/metromenorrhagia, Her mother also had anemia  -  von Willebrand factor panel -  Check PTT - Seasonique at discharge - She is advised to follow up with her PCP or gynecologist regarding further evaluation of her heavy and irregular periods. Consider PCOS.   Lower extremity edema, concerning for development of heart failure from long-standing severe anemia. Her echocardiogram, however, demonstrated preserved ejection fraction 55-60% and no suggestion of diastolic heart failure.  Mild to moderate MR, trivial pericardial effusion  Hypokalemia, mild. Given oral potassium supplementation.   Diet:  regular Access:  PIV IVF:  off Proph:  SCDs  Code Status: full Family Communication: patient and family Disposition Plan: home tomorrow   Procedures:  PRBC transfusion 4 units Consultations:  None   Antibiotics:  none   HPI/Subjective:  Feeling better after first blood transfusions, more energy    Objective: Filed Vitals:   06/12/13 1347 06/12/13 1405 06/12/13 1456 06/12/13 1601  BP: 111/75 111/69 115/78 117/80  Pulse: 79 77 84 82   Temp: 97.3 F (36.3 C) 97.3 F (36.3 C) 97.3 F (36.3 C) 97.8 F (36.6 C)  TempSrc: Oral Oral Oral Oral  Resp: 18 18 17 18   Height:      Weight:      SpO2: 100% 100% 100% 100%    Intake/Output Summary (Last 24 hours) at 06/12/13 1630 Last data filed at 06/12/13 1324  Gross per 24 hour  Intake 1171.5 ml  Output      0 ml  Net 1171.5 ml   Filed Weights   06/11/13 1931  Weight: 92.352 kg (203 lb 9.6 oz)    Exam:   General:  BF, No acute distress  HEENT:  NCAT, MMM  Cardiovascular:  RRR, nl S1, S2 no mrg, 2+ pulses, warm extremities  Respiratory:  CTAB, no increased WOB  Abdomen:   NABS, soft, NT/ND  MSK:   Normal tone and bulk, no LEE  Neuro:  Grossly intact  Data Reviewed: Basic Metabolic Panel:  Recent Labs Lab 06/11/13 1711  NA 142  K 3.5*  CL 105  CO2 24  GLUCOSE 99  BUN 10  CREATININE 0.55  CALCIUM 9.1   Liver Function Tests:  Recent Labs Lab 06/11/13 1711  AST 15  ALT 10  ALKPHOS 71  BILITOT <0.2*  PROT 7.3  ALBUMIN 3.6   No results found for this basename: LIPASE, AMYLASE,  in the last 168 hours No results found for this basename: AMMONIA,  in the last 168 hours CBC:  Recent Labs Lab 06/11/13 1711 06/12/13 0535  WBC 4.4 3.6*  NEUTROABS 2.7  --   HGB 4.3* 6.0*  HCT 16.6* 21.4*  MCV 53.1*  61.1*  PLT 359 292   Cardiac Enzymes: No results found for this basename: CKTOTAL, CKMB, CKMBINDEX, TROPONINI,  in the last 168 hours BNP (last 3 results) No results found for this basename: PROBNP,  in the last 8760 hours CBG: No results found for this basename: GLUCAP,  in the last 168 hours  No results found for this or any previous visit (from the past 240 hour(s)).   Studies: No results found.  Scheduled Meds: . ferrous sulfate  325 mg Oral TID WC  . ferumoxytol  510 mg Intravenous Once  . vitamin B-12  1,000 mcg Oral Daily   Continuous Infusions:   Principal Problem:   Microcytic anemia Active Problems:   Hypokalemia    Menorrhagia   Anemia    Time spent: 30 min    Reizy Dunlow, [facility] Pager [phone]. If 7PM-7AM, please contact night-coverage at [email or web address], password [facility] 06/12/2013, 4:30 PM  LOS: 1 day

## 2013-06-13 DIAGNOSIS — D518 Other vitamin B12 deficiency anemias: Secondary | ICD-10-CM

## 2013-06-13 LAB — TYPE AND SCREEN
ABO/RH(D): B POS
ANTIBODY SCREEN: NEGATIVE
UNIT DIVISION: 0
UNIT DIVISION: 0
Unit division: 0
Unit division: 0

## 2013-06-18 LAB — VON WILLEBRAND PANEL
COAGULATION FACTOR VIII: 69 % — AB (ref 73–140)
Ristocetin Co-factor, Plasma: 75 % (ref 42–200)
Von Willebrand Antigen, Plasma: 163 % (ref 50–217)

## 2014-01-06 ENCOUNTER — Encounter: Payer: Self-pay | Admitting: *Deleted

## 2014-01-24 ENCOUNTER — Emergency Department (HOSPITAL_COMMUNITY)
Admission: EM | Admit: 2014-01-24 | Discharge: 2014-01-24 | Disposition: A | Discharge status: Home / Self Care | Payer: No Typology Code available for payment source | Attending: Emergency Medicine | Admitting: Emergency Medicine

## 2014-01-24 ENCOUNTER — Encounter (HOSPITAL_COMMUNITY): Payer: Self-pay | Admitting: Emergency Medicine

## 2014-01-24 ENCOUNTER — Emergency Department (HOSPITAL_COMMUNITY): Payer: No Typology Code available for payment source

## 2014-01-24 DIAGNOSIS — Z3202 Encounter for pregnancy test, result negative: Secondary | ICD-10-CM | POA: Insufficient documentation

## 2014-01-24 DIAGNOSIS — R509 Fever, unspecified: Secondary | ICD-10-CM

## 2014-01-24 DIAGNOSIS — R1032 Left lower quadrant pain: Secondary | ICD-10-CM | POA: Diagnosis not present

## 2014-01-24 DIAGNOSIS — D649 Anemia, unspecified: Secondary | ICD-10-CM | POA: Insufficient documentation

## 2014-01-24 DIAGNOSIS — Z792 Long term (current) use of antibiotics: Secondary | ICD-10-CM | POA: Insufficient documentation

## 2014-01-24 DIAGNOSIS — Z79899 Other long term (current) drug therapy: Secondary | ICD-10-CM | POA: Diagnosis not present

## 2014-01-24 DIAGNOSIS — J45909 Unspecified asthma, uncomplicated: Secondary | ICD-10-CM | POA: Diagnosis not present

## 2014-01-24 DIAGNOSIS — R109 Unspecified abdominal pain: Secondary | ICD-10-CM | POA: Insufficient documentation

## 2014-01-24 HISTORY — DX: Encounter for other specified aftercare: Z51.89

## 2014-01-24 LAB — COMPREHENSIVE METABOLIC PANEL
ALBUMIN: 3.6 g/dL (ref 3.5–5.2)
ALK PHOS: 89 U/L (ref 39–117)
ALT: 13 U/L (ref 0–35)
AST: 18 U/L (ref 0–37)
Anion gap: 13 (ref 5–15)
BUN: 8 mg/dL (ref 6–23)
CHLORIDE: 103 meq/L (ref 96–112)
CO2: 28 meq/L (ref 19–32)
CREATININE: 0.61 mg/dL (ref 0.50–1.10)
Calcium: 9.3 mg/dL (ref 8.4–10.5)
GFR calc Af Amer: >90 mL/min (ref >90)
Glucose, Bld: 80 mg/dL (ref 70–99)
Potassium: 4 mEq/L (ref 3.7–5.3)
SODIUM: 144 meq/L (ref 137–147)
Total Bilirubin: 0.3 mg/dL (ref 0.3–1.2)
Total Protein: 7.6 g/dL (ref 6.0–8.3)

## 2014-01-24 LAB — CBC WITH DIFFERENTIAL/PLATELET
BASOS PCT: 0 % (ref 0–1)
Basophils Absolute: 0 10*3/uL (ref 0.0–0.1)
Eosinophils Absolute: 0 10*3/uL (ref 0.0–0.7)
Eosinophils Relative: 0 % (ref 0–5)
HEMATOCRIT: 42.3 % (ref 36.0–46.0)
Hemoglobin: 13.6 g/dL (ref 12.0–15.0)
LYMPHS PCT: 4 % — AB (ref 12–46)
Lymphs Abs: 0.6 10*3/uL — ABNORMAL LOW (ref 0.7–4.0)
MCH: 24.2 pg — ABNORMAL LOW (ref 26.0–34.0)
MCHC: 32.2 g/dL (ref 30.0–36.0)
MCV: 75.4 fL — ABNORMAL LOW (ref 78.0–100.0)
MONO ABS: 0.7 10*3/uL (ref 0.1–1.0)
Monocytes Relative: 5 % (ref 3–12)
NEUTROS ABS: 13.7 10*3/uL — AB (ref 1.7–7.7)
Neutrophils Relative %: 91 % — ABNORMAL HIGH (ref 43–77)
Platelets: 209 10*3/uL (ref 150–400)
RBC: 5.61 MIL/uL — ABNORMAL HIGH (ref 3.87–5.11)
RDW: 13.5 % (ref 11.5–15.5)
WBC: 15 10*3/uL — AB (ref 4.0–10.5)

## 2014-01-24 LAB — URINALYSIS, ROUTINE W REFLEX MICROSCOPIC
Bilirubin Urine: NEGATIVE
GLUCOSE, UA: NEGATIVE mg/dL
Ketones, ur: NEGATIVE mg/dL
LEUKOCYTES UA: NEGATIVE
Nitrite: NEGATIVE
PH: 8 (ref 5.0–8.0)
Protein, ur: NEGATIVE mg/dL
Specific Gravity, Urine: 1.013 (ref 1.005–1.030)
Urobilinogen, UA: 0.2 mg/dL (ref 0.0–1.0)

## 2014-01-24 LAB — URINE MICROSCOPIC-ADD ON

## 2014-01-24 LAB — WET PREP, GENITAL
CLUE CELLS WET PREP: NONE SEEN
Trich, Wet Prep: NONE SEEN
Yeast Wet Prep HPF POC: NONE SEEN

## 2014-01-24 LAB — POC URINE PREG, ED: Preg Test, Ur: NEGATIVE

## 2014-01-24 MED ORDER — ACETAMINOPHEN 325 MG PO TABS
650.0000 mg | ORAL_TABLET | Freq: Once | ORAL | Status: AC
  Administered 2014-01-24: 650 mg via ORAL
  Filled 2014-01-24: qty 2

## 2014-01-24 MED ORDER — AZITHROMYCIN 250 MG PO TABS
1000.0000 mg | ORAL_TABLET | Freq: Once | ORAL | Status: AC
  Administered 2014-01-24: 1000 mg via ORAL
  Filled 2014-01-24: qty 4

## 2014-01-24 MED ORDER — MORPHINE SULFATE 4 MG/ML IJ SOLN
4.0000 mg | Freq: Once | INTRAMUSCULAR | Status: AC
  Administered 2014-01-24: 4 mg via INTRAVENOUS
  Filled 2014-01-24: qty 1

## 2014-01-24 MED ORDER — IOHEXOL 300 MG/ML  SOLN
100.0000 mL | Freq: Once | INTRAMUSCULAR | Status: AC | PRN
  Administered 2014-01-24: 100 mL via INTRAVENOUS

## 2014-01-24 MED ORDER — SODIUM CHLORIDE 0.9 % IV BOLUS (SEPSIS)
1000.0000 mL | Freq: Once | INTRAVENOUS | Status: AC
  Administered 2014-01-24: 1000 mL via INTRAVENOUS

## 2014-01-24 MED ORDER — ONDANSETRON HCL 4 MG/2ML IJ SOLN
4.0000 mg | Freq: Once | INTRAMUSCULAR | Status: AC
  Administered 2014-01-24: 4 mg via INTRAVENOUS
  Filled 2014-01-24: qty 2

## 2014-01-24 MED ORDER — HYDROCODONE-ACETAMINOPHEN 5-325 MG PO TABS: 1.0000 | ORAL_TABLET | Freq: Four times a day (QID) | ORAL | Status: DC | PRN

## 2014-01-24 MED ORDER — DOXYCYCLINE HYCLATE 100 MG PO CAPS: 100.0000 mg | ORAL_CAPSULE | Freq: Two times a day (BID) | ORAL | Status: DC

## 2014-01-24 MED ORDER — ONDANSETRON HCL 4 MG PO TABS: 4.0000 mg | ORAL_TABLET | Freq: Three times a day (TID) | ORAL | Status: DC | PRN

## 2014-01-24 MED ORDER — LIDOCAINE HCL (PF) 1 % IJ SOLN
INTRAMUSCULAR | Status: AC
  Administered 2014-01-24: 1 mL
  Filled 2014-01-24: qty 5

## 2014-01-24 MED ORDER — IOHEXOL 300 MG/ML  SOLN
25.0000 mL | INTRAMUSCULAR | Status: DC | PRN
Start: 2014-01-24 — End: 2014-01-24
  Administered 2014-01-24: 25 mL via ORAL

## 2014-01-24 MED ORDER — CEFTRIAXONE SODIUM 250 MG IJ SOLR
250.0000 mg | Freq: Once | INTRAMUSCULAR | Status: AC
  Administered 2014-01-24: 250 mg via INTRAMUSCULAR
  Filled 2014-01-24: qty 250

## 2014-01-24 NOTE — ED Notes (Signed)
Pt coming from home with c/o of LLQ pain and Left lower back pain with onset this morning around 5am.  Pt states she was awoke from sleep from the pain.  Pain is a 7/10 with intermittent in consistency.

## 2014-01-24 NOTE — ED Notes (Signed)
Pt returned from [place]

## 2014-01-24 NOTE — ED Notes (Signed)
Lab phoned, cannot run Urine Pregnancy off the urine sent down.  Patient asked to provide an additional sample.

## 2014-01-24 NOTE — ED Notes (Signed)
Pt transported to [place] by ED staff

## 2014-01-24 NOTE — Discharge Instructions (Signed)
You were evaluated for abdominal pain.   You were also found to have a fever.  YOur imaging does not reveal a source of infection or cause of your pain.   You need to follow-up closely if your symptoms persist.  GIven your new sexual partner, will treat you for pelvic inflammatory disease with 10 days of doxycycline.  Abdominal Pain, Women Abdominal (stomach, pelvic, or belly) pain can be caused by many things. It is important to tell your doctor:  The location of the pain.  Does it come and go or is it present all the time?  Are there things that start the pain (eating certain foods, exercise)?  Are there other symptoms associated with the pain (fever, nausea, vomiting, diarrhea)? All of this is helpful to know when trying to find the cause of the pain. CAUSES   Stomach: virus or bacteria infection, or ulcer.  Intestine: appendicitis (inflamed appendix), regional ileitis (Crohn's disease), ulcerative colitis (inflamed colon), irritable bowel syndrome, diverticulitis (inflamed diverticulum of the colon), or cancer of the stomach or intestine.  Gallbladder disease or stones in the gallbladder.  Kidney disease, kidney stones, or infection.  Pancreas infection or cancer.  Fibromyalgia (pain disorder).  Diseases of the female organs:  Uterus: fibroid (non-cancerous) tumors or infection.  Fallopian tubes: infection or tubal pregnancy.  Ovary: cysts or tumors.  Pelvic adhesions (scar tissue).  Endometriosis (uterus lining tissue growing in the pelvis and on the pelvic organs).  Pelvic congestion syndrome (female organs filling up with blood just before the menstrual period).  Pain with the menstrual period.  Pain with ovulation (producing an egg).  Pain with an IUD (intrauterine device, birth control) in the uterus.  Cancer of the female organs.  Functional pain (pain not caused by a disease, may improve without treatment).  Psychological pain.  Depression. DIAGNOSIS    Your doctor will decide the seriousness of your pain by doing an examination.  Blood tests.  X-rays.  Ultrasound.  CT scan (computed tomography, special type of X-ray).  MRI (magnetic resonance imaging).  Cultures, for infection.  Barium enema (dye inserted in the large intestine, to better view it with X-rays).  Colonoscopy (looking in intestine with a lighted tube).  Laparoscopy (minor surgery, looking in abdomen with a lighted tube).  Major abdominal exploratory surgery (looking in abdomen with a large incision). TREATMENT  The treatment will depend on the cause of the pain.   Many cases can be observed and treated at home.  Over-the-counter medicines recommended by your caregiver.  Prescription medicine.  Antibiotics, for infection.  Birth control pills, for painful periods or for ovulation pain.  Hormone treatment, for endometriosis.  Nerve blocking injections.  Physical therapy.  Antidepressants.  Counseling with a psychologist or psychiatrist.  Minor or major surgery. HOME CARE INSTRUCTIONS   Do not take laxatives, unless directed by your caregiver.  Take over-the-counter pain medicine only if ordered by your caregiver. Do not take aspirin because it can cause an upset stomach or bleeding.  Try a clear liquid diet (broth or water) as ordered by your caregiver. Slowly move to a bland diet, as tolerated, if the pain is related to the stomach or intestine.  Have a thermometer and take your temperature several times a day, and record it.  Bed rest and sleep, if it helps the pain.  Avoid sexual intercourse, if it causes pain.  Avoid stressful situations.  Keep your follow-up appointments and tests, as your caregiver orders.  If the pain does  not go away with medicine or surgery, you may try:  Acupuncture.  Relaxation exercises (yoga, meditation).  Group therapy.  Counseling. SEEK MEDICAL CARE IF:   You notice certain foods cause stomach  pain.  Your home care treatment is not helping your pain.  You need stronger pain medicine.  You want your IUD removed.  You feel faint or lightheaded.  You develop nausea and vomiting.  You develop a rash.  You are having side effects or an allergy to your medicine. SEEK IMMEDIATE MEDICAL CARE IF:   Your pain does not go away or gets worse.  You have a fever.  Your pain is felt only in portions of the abdomen. The right side could possibly be appendicitis. The left lower portion of the abdomen could be colitis or diverticulitis.  You are passing blood in your stools (bright red or black tarry stools, with or without vomiting).  You have blood in your urine.  You develop chills, with or without a fever.  You pass out. MAKE SURE YOU:   Understand these instructions.  Will watch your condition.  Will get help right away if you are not doing well or get worse. Document Released: 02/11/2007 Document Revised: 08/31/2013 Document Reviewed: 03/03/2009 [name] Patient Information 2015 [place], [place]. This information is not intended to replace advice given to you by your health care provider. Make sure you discuss any questions you have with your health care provider.

## 2014-01-24 NOTE — ED Notes (Signed)
CT phoned that pt completed oral contrast.

## 2014-01-24 NOTE — ED Provider Notes (Signed)
CSN: [id number]     Arrival date & time 01/24/14  0804 History   First MD Initiated Contact with Patient 01/24/14 0818     Chief Complaint  Patient presents with  . Flank Pain  . Abdominal Pain     (Consider location/radiation/quality/duration/timing/severity/associated sxs/prior Treatment) HPI  This is a 38 year old female who presents with left flank and left lower quadrant pain.  Patient reports that she woke up this morning with pain. She states it is sharp and waxing and waning. It is best at her left flank and left lower quadrant. Noted history of kidney stones.  No similar pain in the past. Current pain is 8/10. Nothing seems to make it better or worse. She has not taken anything at home. Patient reports normal bowel movements. She denies dysuria or hematuria. She denies vomiting or diarrhea. She reports chills without objective fevers.  Reports one new sexual partner. Last menstrual period is current. Reports that she always uses condoms.  Past Medical History  Diagnosis Date  . Asthma   . Anemia   . Blood transfusion without reported diagnosis    Past Surgical History  Procedure Laterality Date  . No past surgeries     No family history on file. History  Substance Use Topics  . Smoking status: Never Smoker   . Smokeless tobacco: Never Used  . Alcohol Use: No   OB History   Grav Para Term Preterm Abortions TAB SAB Ect Mult Living                 Review of Systems  Constitutional: Positive for chills. Negative for fever.  Respiratory: Negative for cough, chest tightness and shortness of breath.   Cardiovascular: Negative for chest pain.  Gastrointestinal: Positive for abdominal pain. Negative for nausea, vomiting, diarrhea, constipation and blood in stool.  Genitourinary: Positive for flank pain. Negative for dysuria.  Musculoskeletal: Negative for back pain.  Skin: Negative for rash.  Neurological: Negative for headaches.  Psychiatric/Behavioral: Negative for  confusion.  All other systems reviewed and are negative.     Allergies  Influenza vaccines; Codeine; and Lasix  Home Medications   Prior to Admission medications   Medication Sig Start Date End Date Taking? Authorizing Provider  albuterol (PROVENTIL HFA;VENTOLIN HFA) 108 (90 BASE) MCG/ACT inhaler Inhale 2 puffs into the lungs every 6 (six) hours as needed for wheezing or shortness of breath.   Yes Historical Provider, MD  ferrous sulfate 325 (65 FE) MG tablet Take 1 tablet (325 mg total) by mouth 3 (three) times daily with meals. 06/12/13  Yes Janece Canterbury, MD  doxycycline (VIBRAMYCIN) 100 MG capsule Take 1 capsule (100 mg total) by mouth 2 (two) times daily. 01/24/14   Merryl Hacker, MD  HYDROcodone-acetaminophen (NORCO/VICODIN) 5-325 MG per tablet Take 1 tablet by mouth every 6 (six) hours as needed for moderate pain or severe pain. 01/24/14   Merryl Hacker, MD  ondansetron (ZOFRAN) 4 MG tablet Take 1 tablet (4 mg total) by mouth every 8 (eight) hours as needed for nausea or vomiting. 01/24/14   Merryl Hacker, MD   BP 124/74  Pulse 117  Temp(Src) 99.6 F (37.6 C) (Oral)  Resp 16  Ht 5\' 4"  (1.626 m)  Wt 229 lb (103.874 kg)  BMI 39.29 kg/m2  SpO2 99%  LMP 01/24/2014 Physical Exam  Nursing note and vitals reviewed. Constitutional: She is oriented to person, place, and time. She appears well-developed and well-nourished. No distress.  HENT:  Head: Normocephalic  and atraumatic.  Poor dentition  Eyes: Pupils are equal, round, and reactive to light.  Cardiovascular: Normal rate, regular rhythm and normal heart sounds.   Pulmonary/Chest: Effort normal and breath sounds normal. No respiratory distress. She has no wheezes.  Abdominal: Soft. Bowel sounds are normal.  Tenderness palpation over the left CVA region and left lower quadrant without rebound or guarding  Genitourinary: Vagina normal.  Blood noted in the vaginal vault, no cervical discharge noted, mild cervical  motion tenderness, left adnexal tenderness  Neurological: She is alert and oriented to person, place, and time.  Skin: Skin is warm and dry.  Psychiatric: She has a normal mood and affect.    ED Course  Procedures (including critical care time) Labs Review Labs Reviewed  WET PREP, GENITAL - Abnormal; Notable for the following:    WBC, Wet Prep HPF POC RARE (*)    All other components within normal limits  CBC WITH DIFFERENTIAL - Abnormal; Notable for the following:    WBC 15.0 (*)    RBC 5.61 (*)    MCV 75.4 (*)    MCH 24.2 (*)    Neutrophils Relative % 91 (*)    Neutro Abs 13.7 (*)    Lymphocytes Relative 4 (*)    Lymphs Abs 0.6 (*)    All other components within normal limits  URINALYSIS, ROUTINE W REFLEX MICROSCOPIC - Abnormal; Notable for the following:    APPearance CLOUDY (*)    Hgb urine dipstick LARGE (*)    All other components within normal limits  GC/CHLAMYDIA PROBE AMP  COMPREHENSIVE METABOLIC PANEL  URINE MICROSCOPIC-ADD ON  POC URINE PREG, ED    Imaging Review US Transvaginal Non-ob  01/24/2014   CLINICAL DATA:  Left lower quadrant pain.  EXAM: TRANSABDOMINAL AND TRANSVAGINAL ULTRASOUND OF PELVIS  TECHNIQUE: Both transabdominal and transvaginal ultrasound examinations of the pelvis were performed. Transabdominal technique was performed for global imaging of the pelvis including uterus, ovaries, adnexal regions, and pelvic cul-de-sac. It was necessary to proceed with endovaginal exam following the transabdominal exam to visualize the uterus and ovaries.  COMPARISON:  CT 01/24/2014  FINDINGS: Uterus  Measurements: 17 cm in length. The uterus size was more accurately measured on the recent CT. There are multiple uterine fibroids and the uterus is diffusely heterogeneous. Largest fibroid measures 7.5 x 6.6 x 6.8 cm.  Endometrium  Not visualized.  Right ovary  Measurements: Not visualized  Left ovary  Measurements: 5.3 x 2.5 x 3.1 cm. Normal appearance of the left ovary  with a small amount of surrounding fluid.  Other findings  Free fluid around the left ovary.  IMPRESSION: Large fibroid uterus. This uterus is very difficult to evaluate with ultrasound. Size and morphology was better characterized on the recent pelvic CT.  Normal appearance of the left ovary with a small amount of surrounding free fluid.  Right ovary is not visualized.   Electronically Signed   By: Markus Daft M.D.   On: 01/24/2014 16:38   US Pelvis Complete  01/24/2014   CLINICAL DATA:  Left lower quadrant pain.  EXAM: TRANSABDOMINAL AND TRANSVAGINAL ULTRASOUND OF PELVIS  TECHNIQUE: Both transabdominal and transvaginal ultrasound examinations of the pelvis were performed. Transabdominal technique was performed for global imaging of the pelvis including uterus, ovaries, adnexal regions, and pelvic cul-de-sac. It was necessary to proceed with endovaginal exam following the transabdominal exam to visualize the uterus and ovaries.  COMPARISON:  CT 01/24/2014  FINDINGS: Uterus  Measurements: 17 cm in length.  The uterus size was more accurately measured on the recent CT. There are multiple uterine fibroids and the uterus is diffusely heterogeneous. Largest fibroid measures 7.5 x 6.6 x 6.8 cm.  Endometrium  Not visualized.  Right ovary  Measurements: Not visualized  Left ovary  Measurements: 5.3 x 2.5 x 3.1 cm. Normal appearance of the left ovary with a small amount of surrounding fluid.  Other findings  Free fluid around the left ovary.  IMPRESSION: Large fibroid uterus. This uterus is very difficult to evaluate with ultrasound. Size and morphology was better characterized on the recent pelvic CT.  Normal appearance of the left ovary with a small amount of surrounding free fluid.  Right ovary is not visualized.   Electronically Signed   By: Markus Daft M.D.   On: 01/24/2014 16:38   Ct Abdomen Pelvis W Contrast  01/24/2014   CLINICAL DATA:  38 year old with left lower quadrant and left flank pain.  EXAM: CT ABDOMEN  AND PELVIS WITH CONTRAST  TECHNIQUE: Multidetector CT imaging of the abdomen and pelvis was performed using the standard protocol following bolus administration of intravenous contrast.  CONTRAST:  171mL OMNIPAQUE IOHEXOL 300 MG/ML  SOLN  COMPARISON:  None.  FINDINGS: Mild atelectasis at the left lung base. Otherwise, the lung bases are clear. No evidence for free air.  There are round gallstones without inflammatory changes or gallbladder distention. Well-circumscribed low-density structure in the right hepatic lobe measures 8 mm and suggestive for a cyst. Trace amount of pericardial fluid at the base of the heart. Portal venous system is patent. Normal appearance of the pancreas, spleen and adrenal glands.  There is mild fullness of the renal pelvises bilaterally, left side greater than right. Normal appearance of the right kidney without perinephric edema. There is a normal appearance the left kidney but there is fluid and stranding along the course of the proximal left ureter and adjacent to the left ovarian vein. No evidence for left ovarian thrombosis. There is no evidence for a kidney stone or ureter stone. In addition, there is fluid in the pelvis. A small amount of fluid in the right lower quadrant adjacent to a large subserosal fibroid.  The uterus is massively enlarged with multiple uterine fibroids. Large subserosal fibroids along the right side uterus. Uterus measures 18.7 x 9.5 x 18.2 cm. Ovaries and adnexal structures are not well visualized. No acute abnormality involving the small or large bowel.  No acute bone abnormality. Disc space narrowing and disease at L5-S1.  IMPRESSION: Small amount of fluid and edema in the left retroperitoneal region near the proximal left ureter. The etiology for the fluid and inflammatory changes in the left upper retroperitoneum is unknown. There is mild fullness in the left renal pelvis but no definite kidney stone and no focal abnormality in the left kidney. Findings  can be seen with a recently passed stone but no evidence for kidney stones on this examination.  Large fibroid uterus. There is fluid in the pelvis and surrounding the uterus as described. Fluid in the pelvis could be physiologic. Adnexa and ovarian structures are poorly characterized on this examination.  Cholelithiasis.   Electronically Signed   By: Markus Daft M.D.   On: 01/24/2014 11:57     EKG Interpretation None      MDM   Final diagnoses:  Left lower quadrant pain  Fever and chills    Patient presents with left flank and left lower quadrant pain. Initial vital signs notable for temperature of 99.9  and pulse of 115. She denies any vomiting or diarrhea. Differential includes kidney stone, pyelonephritis, diverticulitis, or ovarian pathology.  Patient noted to have elevated white count of 15. Would be concerned for infection given borderline temperature and tachycardia with leukocytosis. We'll obtain CT scan to evaluate for diverticulitis.  No evidence of urinary tract infection. CT with nonspecific findings of fluid and edema of the left ureter as well as free fluid in the pelvis with an enlarged uterus. Patient reports some improvement of pain. Repeat temperature 102.5 with persistent tachycardia. Patient given Tylenol and a second liter of fluids. Pelvic exam performed. Patient has opted for elective treatment for STDs with Rocephin and azithromycin. She does have left adnexal tenderness. In the setting of fever and abdominal pain, would be concerned for possible TOA.  Ultrasound ordered given poor visualization of the adnexa on CT.  CT neg.  Since admission, patient has developed one episode of vomiting.  GIven zofran.  Reports that overall she is feeling better.  Able to tolerate PO.  Patient's presentation still unclear but considerations include viral gastroenteritis and PID.  Patient elected to start doxycycline to cover for PID.  Given strict return precautions.  HR improved with fluids  and fever management.  After history, exam, and medical workup I feel the patient has been appropriately medically screened and is safe for discharge home. Pertinent diagnoses were discussed with the patient. Patient was given return precautions.     Merryl Hacker, MD 01/25/14 2121

## 2014-01-24 NOTE — ED Notes (Addendum)
Ultrasound machine placed at bedside for MD to find IV access.    Pt encourage to get additional urine sample, hat placed in the commode.

## 2014-01-24 NOTE — ED Notes (Signed)
Ultrasound called to have patient transported over via chaperone.

## 2014-01-24 NOTE — ED Notes (Signed)
This RN attempted IV access with no success.  Second RN in to attempt IV access.

## 2014-01-24 NOTE — ED Notes (Signed)
Ultrasound phoned regarding status, pt is next available.  Will require a female chaperone due to a female ultrasound technician.

## 2014-01-25 LAB — GC/CHLAMYDIA PROBE AMP
CT PROBE, AMP APTIMA: NEGATIVE
GC Probe RNA: NEGATIVE

## 2015-03-31 ENCOUNTER — Encounter (HOSPITAL_COMMUNITY): Payer: Self-pay | Admitting: Emergency Medicine

## 2015-03-31 ENCOUNTER — Emergency Department (HOSPITAL_COMMUNITY)
Admission: EM | Admit: 2015-03-31 | Discharge: 2015-03-31 | Disposition: A | Discharge status: Home / Self Care | Payer: No Typology Code available for payment source | Attending: Emergency Medicine | Admitting: Emergency Medicine

## 2015-03-31 DIAGNOSIS — Z79899 Other long term (current) drug therapy: Secondary | ICD-10-CM | POA: Diagnosis not present

## 2015-03-31 DIAGNOSIS — J45909 Unspecified asthma, uncomplicated: Secondary | ICD-10-CM | POA: Diagnosis not present

## 2015-03-31 DIAGNOSIS — S8011XA Contusion of right lower leg, initial encounter: Secondary | ICD-10-CM | POA: Diagnosis not present

## 2015-03-31 DIAGNOSIS — S161XXA Strain of muscle, fascia and tendon at neck level, initial encounter: Secondary | ICD-10-CM | POA: Diagnosis not present

## 2015-03-31 DIAGNOSIS — S199XXA Unspecified injury of neck, initial encounter: Secondary | ICD-10-CM | POA: Diagnosis present

## 2015-03-31 DIAGNOSIS — D649 Anemia, unspecified: Secondary | ICD-10-CM | POA: Insufficient documentation

## 2015-03-31 DIAGNOSIS — R519 Headache, unspecified: Secondary | ICD-10-CM

## 2015-03-31 DIAGNOSIS — Y92410 Unspecified street and highway as the place of occurrence of the external cause: Secondary | ICD-10-CM | POA: Insufficient documentation

## 2015-03-31 DIAGNOSIS — S0990XA Unspecified injury of head, initial encounter: Secondary | ICD-10-CM | POA: Insufficient documentation

## 2015-03-31 DIAGNOSIS — Y9389 Activity, other specified: Secondary | ICD-10-CM | POA: Diagnosis not present

## 2015-03-31 DIAGNOSIS — Z792 Long term (current) use of antibiotics: Secondary | ICD-10-CM | POA: Diagnosis not present

## 2015-03-31 DIAGNOSIS — Y999 Unspecified external cause status: Secondary | ICD-10-CM | POA: Diagnosis not present

## 2015-03-31 DIAGNOSIS — E669 Obesity, unspecified: Secondary | ICD-10-CM | POA: Insufficient documentation

## 2015-03-31 DIAGNOSIS — R51 Headache: Secondary | ICD-10-CM

## 2015-03-31 MED ORDER — HYDROCODONE-ACETAMINOPHEN 5-325 MG PO TABS
1.0000 | ORAL_TABLET | Freq: Once | ORAL | Status: AC
  Administered 2015-03-31: 1 via ORAL
  Filled 2015-03-31: qty 1

## 2015-03-31 MED ORDER — HYDROCODONE-ACETAMINOPHEN 5-325 MG PO TABS: 1.0000 | ORAL_TABLET | Freq: Four times a day (QID) | ORAL | Status: DC | PRN

## 2015-03-31 MED ORDER — CYCLOBENZAPRINE HCL 10 MG PO TABS: 10.0000 mg | ORAL_TABLET | Freq: Two times a day (BID) | ORAL | Status: DC | PRN
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
## 2015-03-31 NOTE — Discharge Instructions (Signed)
Cervical Sprain °A cervical sprain is an injury in the neck in which the strong, fibrous tissues (ligaments) that connect your neck bones stretch or tear. Cervical sprains can range from mild to severe. Severe cervical sprains can cause the neck vertebrae to be unstable. This can lead to damage of the spinal cord and can result in serious nervous system problems. The amount of time it takes for a cervical sprain to get better depends on the cause and extent of the injury. Most cervical sprains heal in 1 to 3 weeks. °CAUSES  °Severe cervical sprains may be caused by:  °· Contact sport injuries (such as from football, rugby, wrestling, hockey, auto racing, gymnastics, diving, martial arts, or boxing).   °· Motor vehicle collisions.   °· Whiplash injuries. This is an injury from a sudden forward and backward whipping movement of the head and neck.  °· Falls.   °Mild cervical sprains may be caused by:  °· Being in an awkward position, such as while cradling a telephone between your ear and shoulder.   °· Sitting in a chair that does not offer proper support.   °· Working at a poorly designed computer station.   °· Looking up or down for long periods of time.   °SYMPTOMS  °· Pain, soreness, stiffness, or a burning sensation in the front, back, or sides of the neck. This discomfort may develop immediately after the injury or slowly, 24 hours or more after the injury.   °· Pain or tenderness directly in the middle of the back of the neck.   °· Shoulder or upper back pain.   °· Limited ability to move the neck.   °· Headache.   °· Dizziness.   °· Weakness, numbness, or tingling in the hands or arms.   °· Muscle spasms.   °· Difficulty swallowing or chewing.   °· Tenderness and swelling of the neck.   °DIAGNOSIS  °Most of the time your health care provider can diagnose a cervical sprain by taking your history and doing a physical exam. Your health care provider will ask about previous neck injuries and any known neck  problems, such as arthritis in the neck. X-rays may be taken to find out if there are any other problems, such as with the bones of the neck. Other tests, such as a CT scan or MRI, may also be needed.  °TREATMENT  °Treatment depends on the severity of the cervical sprain. Mild sprains can be treated with rest, keeping the neck in place (immobilization), and pain medicines. Severe cervical sprains are immediately immobilized. Further treatment is done to help with pain, muscle spasms, and other symptoms and may include: °· Medicines, such as pain relievers, numbing medicines, or muscle relaxants.   °· Physical therapy. This may involve stretching exercises, strengthening exercises, and posture training. Exercises and improved posture can help stabilize the neck, strengthen muscles, and help stop symptoms from returning.   °HOME CARE INSTRUCTIONS  °· Put ice on the injured area.   °¨ Put ice in a plastic bag.   °¨ Place a towel between your skin and the bag.   °¨ Leave the ice on for 15-20 minutes, 3-4 times a day.   °· If your injury was severe, you may have been given a cervical collar to wear. A cervical collar is a two-piece collar designed to keep your neck from moving while it heals. °¨ Do not remove the collar unless instructed by your health care provider. °¨ If you have long hair, keep it outside of the collar. °¨ Ask your health care provider before making any adjustments to your collar. Minor   Do not remove the collar unless instructed by your health care provider.    If you have long hair, keep it outside of the collar.    Ask your health care provider before making any adjustments to your collar. Minor adjustments may be required over time to improve comfort and reduce pressure on your chin or on the back of your head.    Ifyou are allowed to remove the collar for cleaning or bathing, follow your health care provider's instructions on how to do so safely.    Keep your collar clean by wiping it with mild soap and water and drying it completely. If the collar you have been given includes removable pads, remove them every 1-2 days and hand wash them with soap and water. Allow them to air dry. They should be completely  dry before you wear them in the collar.    If you are allowed to remove the collar for cleaning and bathing, wash and dry the skin of your neck. Check your skin for irritation or sores. If you see any, tell your health care provider.    Do not drive while wearing the collar.    Only take over-the-counter or prescription medicines for pain, discomfort, or fever as directed by your health care provider.    Keep all follow-up appointments as directed by your health care provider.    Keep all physical therapy appointments as directed by your health care provider.    Make any needed adjustments to your workstation to promote good posture.    Avoid positions and activities that make your symptoms worse.    Warm up and stretch before being active to help prevent problems.   SEEK MEDICAL CARE IF:    Your pain is not controlled with medicine.    You are unable to decrease your pain medicine over time as planned.    Your activity level is not improving as expected.   SEEK IMMEDIATE MEDICAL CARE IF:    You develop any bleeding.   You develop stomach upset.   You have signs of an allergic reaction to your medicine.    Your symptoms get worse.    You develop new, unexplained symptoms.    You have numbness, tingling, weakness, or paralysis in any part of your body.   MAKE SURE YOU:    Understand these instructions.   Will watch your condition.   Will get help right away if you are not doing well or get worse.     This information is not intended to replace advice given to you by your health care provider. Make sure you discuss any questions you have with your health care provider.     Document Released: 02/11/2007 Document Revised: 04/21/2013 Document Reviewed: 10/22/2012  [employer] Interactive Patient Education 2016 [employer]  Motor Vehicle Collision  It is common to have multiple bruises and sore muscles after a motor vehicle collision (MVC). These tend to feel worse for the first 24 hours.  You may have the most stiffness and soreness over the first several hours. You may also feel worse when you wake up the first morning after your collision. After this point, you will usually begin to improve with each day. The speed of improvement often depends on the severity of the collision, the number of injuries, and the location and nature of these injuries.  HOME CARE INSTRUCTIONS   Put ice on the injured area.   Put ice in a plastic bag.   Place   a towel between your skin and the bag.   Leave the ice on for 15-20 minutes, 3-4 times a day, or as directed by your health care provider.   Drink enough fluids to keep your urine clear or pale yellow. Do not drink alcohol.   Take a warm shower or bath once or twice a day. This will increase blood flow to sore muscles.   You may return to activities as directed by your caregiver. Be careful when lifting, as this may aggravate neck or back pain.   Only take over-the-counter or prescription medicines for pain, discomfort, or fever as directed by your caregiver. Do not use aspirin. This may increase bruising and bleeding.  SEEK IMMEDIATE MEDICAL CARE IF:   You have numbness, tingling, or weakness in the arms or legs.   You develop severe headaches not relieved with medicine.   You have severe neck pain, especially tenderness in the middle of the back of your neck.   You have changes in bowel or bladder control.   There is increasing pain in any area of the body.   You have shortness of breath, light-headedness, dizziness, or fainting.   You have chest pain.   You feel sick to your stomach (nauseous), throw up (vomit), or sweat.   You have increasing abdominal discomfort.   There is blood in your urine, stool, or vomit.   You have pain in your shoulder (shoulder strap areas).   You feel your symptoms are getting worse.  MAKE SURE YOU:   Understand these instructions.   Will watch your condition.   Will get help right away if you are not doing well  or get worse.     This information is not intended to replace advice given to you by your health care provider. Make sure you discuss any questions you have with your health care provider.     Document Released: 04/16/2005 Document Revised: 05/07/2014 Document Reviewed: 09/13/2010  [employer] Interactive Patient Education 2016 [employer]
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
## 2015-03-31 NOTE — ED Notes (Signed)
Patient up to restroom.  Gait steady and even.  Pt back in room at this time and hooked back up to monitor

## 2015-05-03 ENCOUNTER — Ambulatory Visit
Admission: RE | Admit: 2015-05-03 | Discharge: 2015-05-03 | Disposition: A | Discharge status: Home / Self Care | Payer: PRIVATE HEALTH INSURANCE | Source: Ambulatory Visit | Attending: Internal Medicine | Admitting: Internal Medicine

## 2015-05-03 ENCOUNTER — Other Ambulatory Visit: Payer: Self-pay | Admitting: Nurse Practitioner

## 2015-05-03 DIAGNOSIS — M25551 Pain in right hip: Secondary | ICD-10-CM

## 2015-12-13 ENCOUNTER — Encounter (HOSPITAL_COMMUNITY): Payer: Self-pay | Admitting: *Deleted

## 2015-12-13 ENCOUNTER — Ambulatory Visit (HOSPITAL_COMMUNITY)
Admission: EM | Admit: 2015-12-13 | Discharge: 2015-12-13 | Disposition: A | Discharge status: Home / Self Care | Payer: PRIVATE HEALTH INSURANCE | Attending: Family Medicine | Admitting: Family Medicine

## 2015-12-13 DIAGNOSIS — K047 Periapical abscess without sinus: Secondary | ICD-10-CM

## 2015-12-13 MED ORDER — CLINDAMYCIN HCL 300 MG PO CAPS: 300.0000 mg | ORAL_CAPSULE | Freq: Three times a day (TID) | ORAL | 0 refills | Status: DC

## 2015-12-13 MED ORDER — DICLOFENAC POTASSIUM 50 MG PO TABS
50.0000 mg | ORAL_TABLET | Freq: Three times a day (TID) | ORAL | 0 refills | Status: DC
Start: 2015-12-13 — End: 2019-01-22

## 2015-12-13 NOTE — ED Provider Notes (Addendum)
[place]    CSN: [id number] Arrival date & time: 12/13/15  1228  First Provider Contact:  First MD Initiated Contact with Patient 12/13/15 1323        History   Chief Complaint Chief Complaint  Patient presents with  . Facial Swelling    HPI Rebecca Payne is a 40 y.o. female.    Dental Pain  Location:  Lower Quality:  Throbbing Severity:  Moderate Onset quality:  Sudden Duration:  4 hours Chronicity:  New Context: abscess and poor dentition   Relieved by:  None tried Worsened by:  Nothing Ineffective treatments:  None tried Associated symptoms: facial swelling and neck swelling   Associated symptoms: no fever and no trismus   Risk factors: lack of dental care and periodontal disease     Past Medical History:  Diagnosis Date  . Anemia   . Asthma   . Blood transfusion without reported diagnosis     Patient Active Problem List   Diagnosis Date Noted  . Vitamin B12 deficiency anemia 06/12/2013  . Iron deficiency anemia due to chronic blood loss 06/11/2013  . Hypokalemia 06/11/2013  . Menorrhagia 06/11/2013    Past Surgical History:  Procedure Laterality Date  . NO PAST SURGERIES      OB History    No data available       Home Medications    Prior to Admission medications   Medication Sig Start Date End Date Taking? Authorizing Provider  albuterol (PROVENTIL HFA;VENTOLIN HFA) 108 (90 BASE) MCG/ACT inhaler Inhale 2 puffs into the lungs every 6 (six) hours as needed for wheezing or shortness of breath.    Historical Provider, MD  clindamycin (CLEOCIN) 300 MG capsule Take 1 capsule (300 mg total) by mouth 3 (three) times daily. 12/13/15   Billy Fischer, MD  cyclobenzaprine (FLEXERIL) 10 MG tablet Take 1 tablet (10 mg total) by mouth 2 (two) times daily as needed for muscle spasms. 03/31/15   Merryl Hacker, MD  diclofenac (CATAFLAM) 50 MG tablet Take 1 tablet (50 mg total) by mouth 3 (three) times daily. 12/13/15   Billy Fischer, MD    doxycycline (VIBRAMYCIN) 100 MG capsule Take 1 capsule (100 mg total) by mouth 2 (two) times daily. 01/24/14   Merryl Hacker, MD  ferrous sulfate 325 (65 FE) MG tablet Take 1 tablet (325 mg total) by mouth 3 (three) times daily with meals. 06/12/13   Janece Canterbury, MD  HYDROcodone-acetaminophen (NORCO/VICODIN) 5-325 MG tablet Take 1 tablet by mouth every 6 (six) hours as needed. 03/31/15   Merryl Hacker, MD  ondansetron (ZOFRAN) 4 MG tablet Take 1 tablet (4 mg total) by mouth every 8 (eight) hours as needed for nausea or vomiting. 01/24/14   Merryl Hacker, MD    Family History History reviewed. No pertinent family history.  Social History Social History  Substance Use Topics  . Smoking status: Never Smoker  . Smokeless tobacco: Never Used  . Alcohol use No     Allergies   Influenza vaccines; Codeine; and Lasix [furosemide]   Review of Systems Review of Systems  Constitutional: Negative for fever.  HENT: Positive for dental problem and facial swelling.      Physical Exam Triage Vital Signs ED Triage Vitals [12/13/15 1252]  Enc Vitals Group     BP 129/92     Pulse Rate 107     Resp 16     Temp 98.5 F (36.9 C)  Temp Source Oral     SpO2 98 %     Weight      Height      Head Circumference      Peak Flow      Pain Score      Pain Loc      Pain Edu?      Excl. in [place]?    No data found.   Updated Vital Signs BP 129/92 (BP Location: Left Arm)   Pulse 107   Temp 98.5 F (36.9 C) (Oral)   Resp 16   LMP 12/06/2015   SpO2 98%   Visual Acuity Right Eye Distance:   Left Eye Distance:   Bilateral Distance:    Right Eye Near:   Left Eye Near:    Bilateral Near:     Physical Exam  Constitutional: She is oriented to person, place, and time. She appears well-developed and well-nourished.  HENT:  Severe extensive bilat lower mandibular dental caries.  Lymphadenopathy:    She has cervical adenopathy.  Neurological: She is alert and oriented to  person, place, and time.  Nursing note and vitals reviewed.    UC Treatments / Results  Labs (all labs ordered are listed, but only abnormal results are displayed) Labs Reviewed - No data to display  EKG  EKG Interpretation None       Radiology No results found.  Procedures Procedures (including critical care time)  Medications Ordered in UC Medications - No data to display   Initial Impression / Assessment and Plan / UC Course  I have reviewed the triage vital signs and the nursing notes.  Pertinent labs & imaging results that were available during my care of the patient were reviewed by me and considered in my medical decision making (see chart for details).  Clinical Course      Final Clinical Impressions(s) / UC Diagnoses   Final diagnoses:  Dental infection    New Prescriptions New Prescriptions   CLINDAMYCIN (CLEOCIN) 300 MG CAPSULE    Take 1 capsule (300 mg total) by mouth 3 (three) times daily.   DICLOFENAC (CATAFLAM) 50 MG TABLET    Take 1 tablet (50 mg total) by mouth 3 (three) times daily.     Billy Fischer, MD 12/13/15 [place], MD 12/13/15 [phone]

## 2015-12-13 NOTE — ED Triage Notes (Signed)
Pt states  She  Has   Facial  Swelling   As   Well  As  Pain r  Side  Neck    Since  Last  Week     She  States  She  Was   Seen  By  pcp  For  allergys   She  Reports    Needs  Some  Dental  Work  Done

## 2015-12-13 NOTE — Discharge Instructions (Signed)
Take medicine as prescribed, see your dentist as soon as possible °

## 2019-01-22 ENCOUNTER — Encounter (HOSPITAL_COMMUNITY): Payer: Self-pay

## 2019-01-22 ENCOUNTER — Inpatient Hospital Stay (HOSPITAL_COMMUNITY): Payer: BLUE CROSS/BLUE SHIELD

## 2019-01-22 ENCOUNTER — Other Ambulatory Visit: Payer: Self-pay

## 2019-01-22 ENCOUNTER — Inpatient Hospital Stay (HOSPITAL_COMMUNITY)
Admission: EM | Admit: 2019-01-22 | Discharge: 2019-01-24 | DRG: 812 | Disposition: A | Discharge status: Home / Self Care | Payer: BLUE CROSS/BLUE SHIELD | Source: Ambulatory Visit | Attending: Internal Medicine | Admitting: Internal Medicine

## 2019-01-22 DIAGNOSIS — R1905 Periumbilic swelling, mass or lump: Secondary | ICD-10-CM

## 2019-01-22 DIAGNOSIS — N1330 Unspecified hydronephrosis: Secondary | ICD-10-CM | POA: Diagnosis present

## 2019-01-22 DIAGNOSIS — R1900 Intra-abdominal and pelvic swelling, mass and lump, unspecified site: Secondary | ICD-10-CM

## 2019-01-22 DIAGNOSIS — N920 Excessive and frequent menstruation with regular cycle: Secondary | ICD-10-CM

## 2019-01-22 DIAGNOSIS — J45909 Unspecified asthma, uncomplicated: Secondary | ICD-10-CM | POA: Diagnosis present

## 2019-01-22 DIAGNOSIS — E876 Hypokalemia: Secondary | ICD-10-CM | POA: Diagnosis present

## 2019-01-22 DIAGNOSIS — N8500 Endometrial hyperplasia, unspecified: Secondary | ICD-10-CM | POA: Diagnosis present

## 2019-01-22 DIAGNOSIS — D649 Anemia, unspecified: Secondary | ICD-10-CM | POA: Diagnosis present

## 2019-01-22 DIAGNOSIS — D500 Iron deficiency anemia secondary to blood loss (chronic): Principal | ICD-10-CM | POA: Diagnosis present

## 2019-01-22 DIAGNOSIS — D259 Leiomyoma of uterus, unspecified: Secondary | ICD-10-CM | POA: Diagnosis present

## 2019-01-22 DIAGNOSIS — Z79899 Other long term (current) drug therapy: Secondary | ICD-10-CM | POA: Diagnosis not present

## 2019-01-22 DIAGNOSIS — Z885 Allergy status to narcotic agent status: Secondary | ICD-10-CM

## 2019-01-22 DIAGNOSIS — Z87440 Personal history of urinary (tract) infections: Secondary | ICD-10-CM

## 2019-01-22 DIAGNOSIS — Z887 Allergy status to serum and vaccine status: Secondary | ICD-10-CM

## 2019-01-22 DIAGNOSIS — Z9889 Other specified postprocedural states: Secondary | ICD-10-CM

## 2019-01-22 DIAGNOSIS — Z888 Allergy status to other drugs, medicaments and biological substances status: Secondary | ICD-10-CM

## 2019-01-22 LAB — CBC
HCT: 17.5 % — ABNORMAL LOW (ref 36.0–46.0)
Hemoglobin: 4.6 g/dL — CL (ref 12.0–15.0)
MCH: 15.5 pg — ABNORMAL LOW (ref 26.0–34.0)
MCHC: 26.3 g/dL — ABNORMAL LOW (ref 30.0–36.0)
MCV: 58.9 fL — ABNORMAL LOW (ref 80.0–100.0)
Platelets: 100 10*3/uL — ABNORMAL LOW (ref 150–400)
RBC: 2.97 MIL/uL — ABNORMAL LOW (ref 3.87–5.11)
RDW: 29.2 % — ABNORMAL HIGH (ref 11.5–15.5)
WBC: 6 10*3/uL (ref 4.0–10.5)
nRBC: 0.3 % — ABNORMAL HIGH (ref 0.0–0.2)

## 2019-01-22 LAB — COMPREHENSIVE METABOLIC PANEL
ALT: 10 U/L (ref 0–44)
AST: 17 U/L (ref 15–41)
Albumin: 3.5 g/dL (ref 3.5–5.0)
Alkaline Phosphatase: 65 U/L (ref 38–126)
Anion gap: 13 (ref 5–15)
BUN: <5 mg/dL — ABNORMAL LOW (ref 6–20)
CO2: 20 mmol/L — ABNORMAL LOW (ref 22–32)
Calcium: 9.3 mg/dL (ref 8.9–10.3)
Chloride: 107 mmol/L (ref 98–111)
Creatinine, Ser: 0.53 mg/dL (ref 0.44–1.00)
GFR calc Af Amer: >60 mL/min (ref >60)
GFR calc non Af Amer: >60 mL/min (ref >60)
Glucose, Bld: 92 mg/dL (ref 70–99)
Potassium: 2.9 mmol/L — ABNORMAL LOW (ref 3.5–5.1)
Sodium: 140 mmol/L (ref 135–145)
Total Bilirubin: 0.2 mg/dL — ABNORMAL LOW (ref 0.3–1.2)
Total Protein: 6.7 g/dL (ref 6.5–8.1)

## 2019-01-22 LAB — CBC WITH DIFFERENTIAL/PLATELET
Abs Immature Granulocytes: 0.09 10*3/uL — ABNORMAL HIGH (ref 0.00–0.07)
Basophils Absolute: 0 10*3/uL (ref 0.0–0.1)
Basophils Relative: 1 %
Eosinophils Absolute: 0.2 10*3/uL (ref 0.0–0.5)
Eosinophils Relative: 3 %
HCT: 16.9 % — ABNORMAL LOW (ref 36.0–46.0)
Hemoglobin: 4.6 g/dL — CL (ref 12.0–15.0)
Immature Granulocytes: 2 %
Lymphocytes Relative: 27 %
Lymphs Abs: 1.6 10*3/uL (ref 0.7–4.0)
MCH: 15.7 pg — ABNORMAL LOW (ref 26.0–34.0)
MCHC: 27.2 g/dL — ABNORMAL LOW (ref 30.0–36.0)
MCV: 57.7 fL — ABNORMAL LOW (ref 80.0–100.0)
Monocytes Absolute: 0.4 10*3/uL (ref 0.1–1.0)
Monocytes Relative: 6 %
Neutro Abs: 3.7 10*3/uL (ref 1.7–7.7)
Neutrophils Relative %: 61 %
Platelets: 93 10*3/uL — ABNORMAL LOW (ref 150–400)
RBC: 2.93 MIL/uL — ABNORMAL LOW (ref 3.87–5.11)
RDW: 29 % — ABNORMAL HIGH (ref 11.5–15.5)
WBC: 6 10*3/uL (ref 4.0–10.5)
nRBC: 0.5 % — ABNORMAL HIGH (ref 0.0–0.2)

## 2019-01-22 LAB — SAVE SMEAR(SSMR), FOR PROVIDER SLIDE REVIEW

## 2019-01-22 LAB — VITAMIN B12: Vitamin B-12: 268 pg/mL (ref 180–914)

## 2019-01-22 LAB — RETICULOCYTES
Immature Retic Fract: 44.9 % — ABNORMAL HIGH (ref 2.3–15.9)
RBC.: 2.97 MIL/uL — ABNORMAL LOW (ref 3.87–5.11)
Retic Count, Absolute: 13.4 10*3/uL — ABNORMAL LOW (ref 19.0–186.0)
Retic Ct Pct: 0.5 % (ref 0.4–3.1)

## 2019-01-22 LAB — POC OCCULT BLOOD, ED: Fecal Occult Bld: NEGATIVE

## 2019-01-22 LAB — APTT: aPTT: 31 seconds (ref 24–36)

## 2019-01-22 LAB — I-STAT BETA HCG BLOOD, ED (MC, WL, AP ONLY): I-stat hCG, quantitative: <5 m[IU]/mL (ref <5)

## 2019-01-22 LAB — LACTATE DEHYDROGENASE: LDH: 191 U/L (ref 98–192)

## 2019-01-22 LAB — IRON AND TIBC
Iron: 20 ug/dL — ABNORMAL LOW (ref 28–170)
Saturation Ratios: 5 % — ABNORMAL LOW (ref 10.4–31.8)
TIBC: 385 ug/dL (ref 250–450)
UIBC: 365 ug/dL

## 2019-01-22 LAB — PROTIME-INR
INR: 1.2 (ref 0.8–1.2)
Prothrombin Time: 14.7 seconds (ref 11.4–15.2)

## 2019-01-22 LAB — FERRITIN: Ferritin: 4 ng/mL — ABNORMAL LOW (ref 11–307)

## 2019-01-22 LAB — FOLATE: Folate: 5.9 ng/mL — ABNORMAL LOW (ref >5.9)

## 2019-01-22 LAB — PREPARE RBC (CROSSMATCH)

## 2019-01-22 MED ORDER — CYANOCOBALAMIN 500 MCG PO TABS
250.0000 ug | ORAL_TABLET | Freq: Every day | ORAL | Status: DC
  Administered 2019-01-22 – 2019-01-24 (×3): 250 ug via ORAL
  Filled 2019-01-22 (×3): qty 1

## 2019-01-22 MED ORDER — SODIUM CHLORIDE 0.9 % IV SOLN: 10.0000 mL/h | Freq: Once | INTRAVENOUS | Status: DC

## 2019-01-22 MED ORDER — ALBUTEROL SULFATE (2.5 MG/3ML) 0.083% IN NEBU: 2.5000 mg | INHALATION_SOLUTION | Freq: Four times a day (QID) | RESPIRATORY_TRACT | Status: DC | PRN

## 2019-01-22 MED ORDER — ALBUTEROL SULFATE HFA 108 (90 BASE) MCG/ACT IN AERS: 2.0000 | INHALATION_SPRAY | Freq: Four times a day (QID) | RESPIRATORY_TRACT | Status: DC | PRN

## 2019-01-22 MED ORDER — FOLIC ACID 1 MG PO TABS
1.0000 mg | ORAL_TABLET | Freq: Every day | ORAL | Status: DC
  Administered 2019-01-23 – 2019-01-24 (×2): 1 mg via ORAL
  Filled 2019-01-22 (×2): qty 1

## 2019-01-22 MED ORDER — POTASSIUM CHLORIDE CRYS ER 20 MEQ PO TBCR
40.0000 meq | EXTENDED_RELEASE_TABLET | Freq: Once | ORAL | Status: AC
  Administered 2019-01-22: 40 meq via ORAL
  Filled 2019-01-22: qty 2

## 2019-01-22 MED ORDER — ADULT MULTIVITAMIN W/MINERALS CH
1.0000 | ORAL_TABLET | Freq: Every day | ORAL | Status: DC
  Administered 2019-01-22 – 2019-01-24 (×3): 1 via ORAL
  Filled 2019-01-22 (×3): qty 1

## 2019-01-22 MED ORDER — POTASSIUM CHLORIDE IN NACL 20-0.9 MEQ/L-% IV SOLN
INTRAVENOUS | Status: AC
  Administered 2019-01-22: 14:00:00 via INTRAVENOUS
  Filled 2019-01-22: qty 1000

## 2019-01-22 MED ORDER — ACETAMINOPHEN 650 MG RE SUPP: 650.0000 mg | Freq: Four times a day (QID) | RECTAL | Status: DC | PRN

## 2019-01-22 MED ORDER — FERROUS SULFATE 325 (65 FE) MG PO TABS
325.0000 mg | ORAL_TABLET | Freq: Three times a day (TID) | ORAL | Status: DC
  Administered 2019-01-22 – 2019-01-24 (×5): 325 mg via ORAL
  Filled 2019-01-22 (×5): qty 1

## 2019-01-22 MED ORDER — ACETAMINOPHEN 325 MG PO TABS: 650.0000 mg | ORAL_TABLET | Freq: Four times a day (QID) | ORAL | Status: DC | PRN

## 2019-01-22 NOTE — Plan of Care (Signed)

## 2019-01-22 NOTE — ED Provider Notes (Addendum)
[facility] DEPART[facility] Provider Note   CSN: [id number] Arrival date & time: 01/22/19  0940     History   Chief Complaint Chief Complaint  Patient presents with  . Anemia    HPI Rebecca Payne is a 43 y.o. female.     HPI 43 year old female presents today stating that she was told to come into the ED by her primary care provider because she is anemic.  She states that she had labs drawn yesterday.  She was called and told that her hemoglobin is 4.  She has had anemia in the past diagnosed in 2015.  She states that that time she required blood transfusion.  She states that she took iron for some period of time but has not been taking this.  She was not told why she was anemic.  She states that she has normal menses.  She denies heavy bleeding.  She denies any GI bleeding with no history of upper or lower GI bleeds.  She denies bright red blood per rectum, hematemesis, or dark tarry stools.  She denies dyspnea, lightheadedness, chest pain, or weakness. Past Medical History:  Diagnosis Date  . Anemia   . Asthma   . Blood transfusion without reported diagnosis     Patient Active Problem List   Diagnosis Date Noted  . Vitamin B12 deficiency anemia 06/12/2013  . Iron deficiency anemia due to chronic blood loss 06/11/2013  . Hypokalemia 06/11/2013  . Menorrhagia 06/11/2013    Past Surgical History:  Procedure Laterality Date  . NO PAST SURGERIES       OB History   No obstetric history on file.      Home Medications    Prior to Admission medications   Medication Sig Start Date End Date Taking? Authorizing Provider  albuterol (PROVENTIL HFA;VENTOLIN HFA) 108 (90 BASE) MCG/ACT inhaler Inhale 2 puffs into the lungs every 6 (six) hours as needed for wheezing or shortness of breath.    [provider]  clindamycin (CLEOCIN) 300 MG capsule Take 1 capsule (300 mg total) by mouth 3 (three) times daily. 12/13/15   Billy Fischer, MD   cyclobenzaprine (FLEXERIL) 10 MG tablet Take 1 tablet (10 mg total) by mouth 2 (two) times daily as needed for muscle spasms. 03/31/15   Horton, Barbette Hair, MD  diclofenac (CATAFLAM) 50 MG tablet Take 1 tablet (50 mg total) by mouth 3 (three) times daily. 12/13/15   Billy Fischer, MD  doxycycline (VIBRAMYCIN) 100 MG capsule Take 1 capsule (100 mg total) by mouth 2 (two) times daily. 01/24/14   Horton, Barbette Hair, MD  ferrous sulfate 325 (65 FE) MG tablet Take 1 tablet (325 mg total) by mouth 3 (three) times daily with meals. 06/12/13   Janece Canterbury, MD  HYDROcodone-acetaminophen (NORCO/VICODIN) 5-325 MG tablet Take 1 tablet by mouth every 6 (six) hours as needed. 03/31/15   Horton, Barbette Hair, MD  ondansetron (ZOFRAN) 4 MG tablet Take 1 tablet (4 mg total) by mouth every 8 (eight) hours as needed for nausea or vomiting. 01/24/14   Horton, Barbette Hair, MD    Family History No family history on file.  Social History Social History   Tobacco Use  . Smoking status: Never Smoker  . Smokeless tobacco: Never Used  Substance Use Topics  . Alcohol use: No  . Drug use: No     Allergies   Influenza vaccines, Codeine, and Lasix [furosemide]   Review of Systems Review of Systems  All  other systems reviewed and are negative.    Physical Exam Updated Vital Signs BP (!) 145/80 (BP Location: Right Arm)   Pulse (!) 114   Temp 98.8 F (37.1 C) (Oral)   Resp 20   Ht 1.626 m (5\' 4" )   Wt 94.3 kg   SpO2 100%   BMI 35.70 kg/m   Physical Exam Vitals signs and nursing note reviewed.  Constitutional:      General: She is not in acute distress.    Appearance: Normal appearance. She is not ill-appearing.  HENT:     Head: Normocephalic and atraumatic.     Right Ear: External ear normal.     Left Ear: External ear normal.     Nose: Nose normal.     Mouth/Throat:     Mouth: Mucous membranes are moist.  Eyes:     Comments: Conjunctive are pale  Neck:     Musculoskeletal: Normal range of  motion.  Cardiovascular:     Rate and Rhythm: Tachycardia present.     Pulses: Normal pulses.     Heart sounds: Normal heart sounds.  Pulmonary:     Effort: Pulmonary effort is normal.     Breath sounds: Normal breath sounds.  Abdominal:     General: Bowel sounds are normal.     Palpations: Abdomen is soft. There is mass.     Comments: Patient with large mass palpated which appears to be midline but goes up into the right upper quadrant It is nontender  Genitourinary:    Comments: Patient reports having pelvic and Pap smear done yesterday Musculoskeletal: Normal range of motion.  Skin:    General: Skin is warm and dry.     Capillary Refill: Capillary refill takes less than 2 seconds.  Neurological:     General: No focal deficit present.     Mental Status: She is alert.  Psychiatric:        Mood and Affect: Mood normal.      ED Treatments / Results  Labs (all labs ordered are listed, but only abnormal results are displayed) Labs Reviewed  COMPREHENSIVE METABOLIC PANEL  CBC  VITAMIN B12  FOLATE  IRON AND TIBC  FERRITIN  RETICULOCYTES  OCCULT BLOOD X 1 CARD TO LAB, STOOL  POC OCCULT BLOOD, ED  I-STAT BETA HCG BLOOD, ED ([facility], [facility], [facility] ONLY)  TYPE AND SCREEN    EKG None  Radiology No results found.  Procedures .Critical Care Performed by: Pattricia Boss, MD Authorized by: Pattricia Boss, MD   Critical care provider statement:    Critical care time (minutes):  30   Critical care was necessary to treat or prevent imminent or life-threatening deterioration of the following conditions:  Circulatory failure   Critical care was time spent personally by me on the following activities:  Discussions with consultants, evaluation of patient's response to treatment, examination of patient, ordering and performing treatments and interventions, ordering and review of laboratory studies, ordering and review of radiographic studies, pulse oximetry, re-evaluation of patient's  condition, obtaining history from patient or surrogate and review of old charts   (including critical care time)  Medications Ordered in ED Medications - No data to display   Initial Impression / Assessment and Plan / ED Course  I have reviewed the triage vital signs and the nursing notes.  Pertinent labs & imaging results that were available during my care of the patient were reviewed by me and considered in my medical decision making (see chart for  details).       Patient presents today here with severe anemia with associated tachycardia but no hypotension.  Iron studies performed revealed low iron and folic acid.  She is also hypokalemic.  Potassium is being repleted.  1 unit of packed red blood cells is ordered.  Discussed with Dr. Berline Lopes on-call for internal medicine. Patient noted to have abdominal mass.  Review of records reveal large fibroids noted before.  Final Clinical Impressions(s) / ED Diagnoses   Final diagnoses:  Symptomatic anemia  Hypokalemia  Periumbilical mass    ED Discharge Orders    None       Pattricia Boss, MD 01/22/19 1214    Pattricia Boss, MD 02/02/19 1141

## 2019-01-22 NOTE — ED Notes (Signed)
Patient transported to Ultrasound 

## 2019-01-22 NOTE — H&P (Addendum)
Date: 01/22/2019               Patient Name:  Rebecca Payne MRN: [id number]  DOB: 01/13/1976 Age / Sex: 43 y.o., female   PCP: Patient, No Pcp Per         Medical Service: Internal Medicine Teaching Service         Attending Physician: Dr. Aldine Contes, MD    First Contact: Dr. Jeanmarie Hubert, MD Pager: [phone]  Second Contact: Dr. Kathi Ludwig, MD Pager: [phone]       After Hours (After 5p/  First Contact Pager: [phone]  weekends / holidays): Second Contact Pager: [phone]   Chief Complaint: Low blood count  History of Present Illness: Patient is a 43 year old female with PMH of anemia, asthma who presents after a hemoglobin of 4 was found during routine lab work by PCP.  Patient reports feeling tired but says this is normal as she works a lot of hours between 2 jobs.  Otherwise, she feels at baseline.  She denies chest pain, shortness of breath, blood in stool, dark stool, changes in urine color, nosebleeds, joint pain, abdominal pain, heartburn.  Patient reports her periods are regular occurring once every 4 weeks and usually last 4 to 5 days, sometimes up to 7 days.  On her heaviest day, she uses 3 to 4 pads (does not soak through them).  Patient has never been pregnant. Patient reports history of anemia in 2015 that required blood transfusions that occurred in the setting of heavy menstrual bleeding.  After that hospitalization, she was started on birth control pills but she does not currently take them.  Patient reports that she was taking iron, folate, and other vitamins but she stopped taking those in the end of 2019.  Patient recently treated for urinary tract infection 2 weeks ago and completed course of antibiotics 1 week ago, no urinary symptoms at this time.  Patient reports diet that includes frequent green leafy vegetables and red meat.  Meds:  * Albuterol inhaler - uses 1-2x/week  Allergies: Allergies as of 01/22/2019 - Review Complete 01/22/2019   Allergen Reaction Noted  . Influenza vaccines  06/11/2013  . Codeine Swelling 04/26/2012  . Lasix [furosemide] Nausea Only 06/11/2013   Past Medical History:  Diagnosis Date  . Anemia   . Asthma   . Blood transfusion without reported diagnosis     Family History:  * Mother with anemia - unknown cause * Denies family history of cancer * Denies family history of diabetes, hypertension  Social History:  * Denies any alcohol usage, denies any history of heavy usage * Denies smoking history * Denies recreational drug usage * Works two jobs - Scientist, research (life sciences) job  Review of Systems: A complete ROS was negative except as per HPI.  Physical Exam: Blood pressure 132/84, pulse (!) 106, temperature 98.8 F (37.1 C), temperature source Oral, resp. rate (!) 21, height 5\' 4"  (1.626 m), weight 94.3 kg, SpO2 98 %. Physical Exam  Constitutional: She is well-developed, well-nourished, and in no distress.  HENT:  Head: Normocephalic and atraumatic.  Eyes: EOM are normal. Right eye exhibits no discharge. Left eye exhibits no discharge.  Neck: Normal range of motion. No tracheal deviation present.  Cardiovascular: Normal rate and regular rhythm. Exam reveals no gallop and no friction rub.  No murmur heard. Pulmonary/Chest: Effort normal and breath sounds normal. No respiratory distress. She has no wheezes. She has no rales.  Abdominal: Soft. She exhibits mass (  mass, midline to RUQ, inferior to liver). She exhibits no distension. There is no abdominal tenderness. There is no rebound and no guarding.  Musculoskeletal: Normal range of motion.        General: No tenderness, deformity or edema.  Neurological: She is alert. Coordination normal.  Skin: Skin is warm and dry. No rash noted. She is not diaphoretic. No erythema.  Psychiatric: Memory and judgment normal.    EKG: personally reviewed my interpretation is not performed  CXR: personally reviewed my interpretation is not performed   Assessment & Plan by Problem: Active Problems:   Symptomatic anemia  Patient is a 43 year old female with past medical history of severe anemia who presents with hemoglobin of 4.6, with no obvious source of bleeding.  # Abdominal mass: # Symptomatic anemia: Hemoglobin of 4.6 on presentation. Lab findings consistent with iron deficiency anemia with iron 20, ferritin 4, and MCV of 58.9. RDW elevated at 29.2, reticulocyte count inappropriately low, TIBC within normal limits, minimally symptomatic with patient feeling tired.  Patient with no apparent dietary deficiencies and no source of bleeding identified on history - denies blood in stool, blood in urine, heavy vaginal bleeding.  No evidence for hemolysis with normal bilirubin. Patient with history of similar level of anemia in 2015 but was occurring in the context of heavy vaginal bleeding. Von willebrand panel at that time was unremarkable. No history of liver disease. There is concern that source of bleeding may be secondary to the abdominal mass present on exam (fibroid uterus vs. ovarian mass)- transvaginal ultrasound in 2015 showed large fibroid uterus, normal appearance of left ovary but did not visualized the right ovary. * 1 unit of blood ordered, will check post-transfusion H/H, will provide IV iron following transfusion * Maintenance fluids with NS 100 ml/hr * Blood smear ordered, POC occult blood collected/pending * Folic acid 1 mg QD, Ferrous sulfate 325 mg TID with meals, Vitamin B12 250 mcg QD, Multivitamin QD * Will evaluate liver function with PT/INR, PTT, LFTs * Transvaginal ultrasound to evaluate mass  # Asthma: Patient with history of asthma, well controlled per the patient.  Reports using albuterol inhaler 1-2 times per week as outpatient. * Albuterol PRN  Diet: Regular DVT ppx: SCDs Dispo: Admit patient to Inpatient with expected length of stay greater than 2 midnights.  Signed: Jeanmarie Hubert, MD 01/22/2019, 1:57 PM   Pager: [phone]

## 2019-01-22 NOTE — ED Triage Notes (Signed)
Pt sent here by PCP for hgb 4, hx of anemia years ago. Pt has no complaints, denies any bloody stools.

## 2019-01-23 ENCOUNTER — Inpatient Hospital Stay (HOSPITAL_COMMUNITY): Payer: BLUE CROSS/BLUE SHIELD

## 2019-01-23 DIAGNOSIS — D259 Leiomyoma of uterus, unspecified: Secondary | ICD-10-CM

## 2019-01-23 DIAGNOSIS — Z9889 Other specified postprocedural states: Secondary | ICD-10-CM

## 2019-01-23 DIAGNOSIS — J45909 Unspecified asthma, uncomplicated: Secondary | ICD-10-CM

## 2019-01-23 DIAGNOSIS — N920 Excessive and frequent menstruation with regular cycle: Secondary | ICD-10-CM

## 2019-01-23 DIAGNOSIS — D649 Anemia, unspecified: Secondary | ICD-10-CM

## 2019-01-23 DIAGNOSIS — Z79899 Other long term (current) drug therapy: Secondary | ICD-10-CM

## 2019-01-23 LAB — CBC
HCT: 19.6 % — ABNORMAL LOW (ref 36.0–46.0)
Hemoglobin: 5.4 g/dL — CL (ref 12.0–15.0)
MCH: 17.6 pg — ABNORMAL LOW (ref 26.0–34.0)
MCHC: 27.6 g/dL — ABNORMAL LOW (ref 30.0–36.0)
MCV: 63.8 fL — ABNORMAL LOW (ref 80.0–100.0)
Platelets: 114 10*3/uL — ABNORMAL LOW (ref 150–400)
RBC: 3.07 MIL/uL — ABNORMAL LOW (ref 3.87–5.11)
RDW: 35.1 % — ABNORMAL HIGH (ref 11.5–15.5)
WBC: 7.5 10*3/uL (ref 4.0–10.5)
nRBC: 1.2 % — ABNORMAL HIGH (ref 0.0–0.2)

## 2019-01-23 LAB — COMPREHENSIVE METABOLIC PANEL
ALT: 8 U/L (ref 0–44)
AST: 14 U/L — ABNORMAL LOW (ref 15–41)
Albumin: 3 g/dL — ABNORMAL LOW (ref 3.5–5.0)
Alkaline Phosphatase: 57 U/L (ref 38–126)
Anion gap: 8 (ref 5–15)
BUN: <5 mg/dL — ABNORMAL LOW (ref 6–20)
CO2: 22 mmol/L (ref 22–32)
Calcium: 8.6 mg/dL — ABNORMAL LOW (ref 8.9–10.3)
Chloride: 108 mmol/L (ref 98–111)
Creatinine, Ser: 0.49 mg/dL (ref 0.44–1.00)
GFR calc Af Amer: >60 mL/min (ref >60)
GFR calc non Af Amer: >60 mL/min (ref >60)
Glucose, Bld: 81 mg/dL (ref 70–99)
Potassium: 3.4 mmol/L — ABNORMAL LOW (ref 3.5–5.1)
Sodium: 138 mmol/L (ref 135–145)
Total Bilirubin: 0.4 mg/dL (ref 0.3–1.2)
Total Protein: 5.8 g/dL — ABNORMAL LOW (ref 6.5–8.1)

## 2019-01-23 LAB — HIV ANTIBODY (ROUTINE TESTING W REFLEX): HIV Screen 4th Generation wRfx: NONREACTIVE

## 2019-01-23 LAB — PREPARE RBC (CROSSMATCH)

## 2019-01-23 LAB — HEMOGLOBIN AND HEMATOCRIT, BLOOD
HCT: 26 % — ABNORMAL LOW (ref 36.0–46.0)
Hemoglobin: 8.1 g/dL — ABNORMAL LOW (ref 12.0–15.0)

## 2019-01-23 MED ORDER — IOHEXOL 300 MG/ML  SOLN
100.0000 mL | Freq: Once | INTRAMUSCULAR | Status: AC | PRN
  Administered 2019-01-23: 100 mL via INTRAVENOUS

## 2019-01-23 MED ORDER — GADOBUTROL 1 MMOL/ML IV SOLN
7.0000 mL | Freq: Once | INTRAVENOUS | Status: AC | PRN
  Administered 2019-01-23: 7 mL via INTRAVENOUS

## 2019-01-23 MED ORDER — SODIUM CHLORIDE 0.9 % IV SOLN
510.0000 mg | Freq: Once | INTRAVENOUS | Status: AC
  Administered 2019-01-23: 510 mg via INTRAVENOUS
  Filled 2019-01-23: qty 17

## 2019-01-23 MED ORDER — SODIUM CHLORIDE 0.9% IV SOLUTION: Freq: Once | INTRAVENOUS | Status: DC

## 2019-01-23 MED ORDER — POTASSIUM CHLORIDE CRYS ER 20 MEQ PO TBCR
40.0000 meq | EXTENDED_RELEASE_TABLET | Freq: Once | ORAL | Status: AC
  Administered 2019-01-23: 40 meq via ORAL
  Filled 2019-01-23: qty 2

## 2019-01-23 NOTE — Progress Notes (Signed)
Assume care, agree with previous Rn's assessment. 

## 2019-01-23 NOTE — Evaluation (Signed)
Physical Therapy Evaluation Patient Details Name: Rebecca Payne MRN: [id number] DOB: 11/23/1975 Today's Date: 01/23/2019   History of Present Illness  43 year old female with PMH of anemia, asthma who presents after a hemoglobin of 4 was found during routine lab work by PCP. Admitted 01/22/19 and given 3 units of PRB.   Clinical Impression  Patient evaluated by Physical Therapy with no further acute PT needs identified. All education has been completed and the patient has no further questions. Pt has no PT or equipment needs. PT is signing off. Thank you for this referral.     Follow Up Recommendations No PT follow up    Equipment Recommendations  None recommended by PT       Precautions / Restrictions Precautions Precautions: None Restrictions Weight Bearing Restrictions: No      Mobility  Bed Mobility Overal bed mobility: Independent                Transfers Overall transfer level: Independent                  Ambulation/Gait Ambulation/Gait assistance: Independent                 Balance Overall balance assessment: Independent                                           Pertinent Vitals/Pain Pain Assessment: No/denies pain    Home Living Family/patient expects to be discharged to:: Private residence Living Arrangements: Alone Available Help at Discharge: Family;Available PRN/intermittently Type of Home: Apartment Home Access: Level entry     Home Layout: One level Home Equipment: None      Prior Function Level of Independence: Independent         Comments: works [employer] Extremity Assessment Upper Extremity Assessment: Overall WFL for tasks assessed    Lower Extremity Assessment Lower Extremity Assessment: Overall WFL for tasks assessed    Cervical / Trunk Assessment Cervical / Trunk Assessment: Normal  Communication   Communication: No  difficulties  Cognition Arousal/Alertness: Awake/alert Behavior During Therapy: WFL for tasks assessed/performed Overall Cognitive Status: Within Functional Limits for tasks assessed                                        General Comments General comments (skin integrity, edema, etc.): VSS        Assessment/Plan    PT Assessment Patent does not need any further PT services         PT Goals (Current goals can be found in the Care Plan section)  Acute Rehab PT Goals Patient Stated Goal: go home PT Goal Formulation: With patient     AM-PAC PT "6 Clicks" Mobility  Outcome Measure Help needed turning from your back to your side while in a flat bed without using bedrails?: None Help needed moving from lying on your back to sitting on the side of a flat bed without using bedrails?: None Help needed moving to and from a bed to a chair (including a wheelchair)?: None Help needed standing up from a chair using your arms (e.g., wheelchair or bedside chair)?: None Help needed to walk in hospital room?: None Help needed climbing 3-5 steps with a  railing? : None 6 Click Score: 24    End of Session Equipment Utilized During Treatment: Gait belt Activity Tolerance: Patient tolerated treatment well Patient left: in bed;with call bell/phone within reach Nurse Communication: Mobility status      Time: [id number] PT Time Calculation (min) (ACUTE ONLY): 18 min   Charges:   PT Evaluation $PT Eval Low Complexity: 1 Low          Rebecca Payne B. Migdalia Dk PT, DPT Acute Rehabilitation Services Pager [phone] Office [phone]   [place] 01/23/2019, 4:05 PM

## 2019-01-23 NOTE — Progress Notes (Signed)
  Subjective:  Patient seen at bedside on rounds this morning.  Patient says she feels less tired and weak but she says she feels little stiff from being in bed.  The patient is returning from MRI.  Discussed that she received 3 units PRBCs and we will recheck her blood counts.  Patient has no abdominal pain at this time.  Last menstrual period was about 2-1/2 weeks ago.   Objective:    Vital Signs (last 24 hours): Vitals:   01/23/19 0613 01/23/19 0626 01/23/19 0747 01/23/19 0928  BP: 129/68 (!) 105/52 128/77 124/81  Pulse: 91 89 89 97  Resp:  20 18 19   Temp: 98.3 F (36.8 C) 98.1 F (36.7 C) 98.9 F (37.2 C) 98.1 F (36.7 C)  TempSrc: Oral Oral Oral Oral  SpO2: 100%  100% 100%  Weight:      Height:        Physical Exam: General Alert and answers questions appropriately, no acute distress  Cardiac Regular rate and rhythm, no murmurs, rubs, or gallops  Pulmonary Clear to auscultation bilaterally without wheezes, rhonchi, or rales  Abdominal Abdominal mass present - midline to RUQ. Nontender  Extremities Trace peripheral edema    Assessment/Plan:   Active Problems:   Symptomatic anemia  Patient is a 43 year old female with past medical history of severe anemia who presented with hemoglobin of 4.6 yesterday with no obvious source of bleeding.  # Abdominal mass: # Symptomatic anemia: Hemoglobin of 4.6 on presentation with lab findings consistent with prolonged iron deficiency anemia (iron 20, ferritin 4).  Per history, no increased vaginal bleeding, no blood in stool, no blood in urine.  POC fecal occult blood was also negative.  CT of the abdomen/pelvis showed enlarged myomatous uterus and MR was recommended for further evaluation.  On MR, findings interpreted as representing cystic endometrial hyperplasia or multiple polyps, but it cannot be distinguished from endometrial cancer and further assessment with endometrial sampling is recommended.  Multiple fibroids are present in  his uterus is larger than in 2015. * Status post transfusion 3 units, hemoglobin of 8.1 at noon today.  Hemodynamically stable, asymptomatic.  Will check a.m. CBC. * Feraheme 510 mg x 1 today * Folic acid 1 mg daily, ferrous sulfate 3 to 25 mg 3 times daily with meals, vitamin B12 250 mcg daily, multivitamin daily * PT/OT consulted to evaluate patient's functional status  Diet: Regular DVT Ppx: SCDs Dispo: Anticipated discharge pending further work-up  Jeanmarie Hubert, MD 01/23/2019, 1:53 PM Pager: [phone]

## 2019-01-24 DIAGNOSIS — N1330 Unspecified hydronephrosis: Secondary | ICD-10-CM

## 2019-01-24 LAB — CBC
HCT: 27.4 % — ABNORMAL LOW (ref 36.0–46.0)
Hemoglobin: 8.1 g/dL — ABNORMAL LOW (ref 12.0–15.0)
MCH: 20.6 pg — ABNORMAL LOW (ref 26.0–34.0)
MCHC: 29.6 g/dL — ABNORMAL LOW (ref 30.0–36.0)
MCV: 69.7 fL — ABNORMAL LOW (ref 80.0–100.0)
Platelets: 186 10*3/uL (ref 150–400)
RBC: 3.93 MIL/uL (ref 3.87–5.11)
WBC: 8.9 10*3/uL (ref 4.0–10.5)
nRBC: 2.4 % — ABNORMAL HIGH (ref 0.0–0.2)

## 2019-01-24 LAB — BASIC METABOLIC PANEL
Anion gap: 10 (ref 5–15)
BUN: 5 mg/dL — ABNORMAL LOW (ref 6–20)
CO2: 19 mmol/L — ABNORMAL LOW (ref 22–32)
Calcium: 8.9 mg/dL (ref 8.9–10.3)
Chloride: 111 mmol/L (ref 98–111)
Creatinine, Ser: 0.51 mg/dL (ref 0.44–1.00)
GFR calc Af Amer: >60 mL/min (ref >60)
GFR calc non Af Amer: >60 mL/min (ref >60)
Glucose, Bld: 82 mg/dL (ref 70–99)
Potassium: 3.7 mmol/L (ref 3.5–5.1)
Sodium: 140 mmol/L (ref 135–145)

## 2019-01-24 MED ORDER — FERROUS SULFATE 325 (65 FE) MG PO TABS: 325.0000 mg | ORAL_TABLET | Freq: Three times a day (TID) | ORAL | 0 refills | Status: DC

## 2019-01-24 MED ORDER — CYANOCOBALAMIN 250 MCG PO TABS: 250.0000 ug | ORAL_TABLET | Freq: Every day | ORAL | 0 refills | Status: DC

## 2019-01-24 MED ORDER — ADULT MULTIVITAMIN W/MINERALS CH: 1.0000 | ORAL_TABLET | Freq: Every day | ORAL | 0 refills | Status: DC

## 2019-01-24 MED ORDER — FOLIC ACID 1 MG PO TABS: 1.0000 mg | ORAL_TABLET | Freq: Every day | ORAL | 0 refills | Status: DC

## 2019-01-24 NOTE — Progress Notes (Signed)
Patient requested a work note, MD paged awaiting for him.

## 2019-01-24 NOTE — Progress Notes (Signed)
Discharge instructions given to patient, all questions answered and concerns addressed.  

## 2019-01-24 NOTE — Progress Notes (Signed)
OT Cancellation Note  Patient Details Name: Rebecca Payne MRN: [id number] DOB: March 05, 1976   Cancelled Treatment:    Reason Eval/Treat Not Completed: OT screened, no needs identified, will sign off(Pt reported independence and had just finished ADL in [place].)  Pt reports independence with mobility and ADL. Pt seen in room ambulating with no difficulty. Per PT note, pt was  Independent with no needs. OT signing off.  Ebony Hail Harold Hedge) Marsa Aris OTR/L Acute Rehabilitation Services Pager: [phone] Office: [place] 01/24/2019, 9:41 AM

## 2019-01-24 NOTE — Progress Notes (Addendum)
  Subjective:  Patient seen at bedside on rounds this morning.  Patient denies fever, chills, shortness of breath, chest pain.  Patient states she feels well, denies any new symptoms.  Discussed plan for discharge today with outpatient follow-up.  Patient is comfortable with plan of care.  Patient reports mild cough that has been ongoing for approximately the past week.   Objective:   Vital Signs (last 24 hours): Vitals:   01/23/19 0747 01/23/19 0928 01/23/19 2050 01/24/19 0530  BP: 128/77 124/81 111/65 (!) 112/56  Pulse: 89 97 99 96  Resp: 18 19 18 18   Temp: 98.9 F (37.2 C) 98.1 F (36.7 C) 98.5 F (36.9 C) 98.1 F (36.7 C)  TempSrc: Oral Oral Oral Oral  SpO2: 100% 100% 100% 97%  Weight:    98.4 kg  Height:        Physical Exam: General Alert and answers questions appropriately, no acute distress  Cardiac  regular rate and rhythm, no murmurs, rubs, or gallops  Pulmonary  clear to auscultation bilaterally without wheezes, rhonchi, or rales  Abdominal Abdominal mass present-midline to right upper quadrant, nontender.  Extremities Trace peripheral edema    Assessment/Plan:   Active Problems:   Symptomatic anemia  Patient is a 43 year old female with past medical history of severe anemia who presented with hemoglobin of 4.6 yesterday without significant symptom and with no obvious source of bleeding.  # Abdominal mass: # Anemia: Hemoglobin of 4.6 on presentation with lab finding consistent with prolonged iron deficiency anemia (iron 20, ferritin 4).  Per history, no increased vaginal bleeding, no blood in stool (FOBT negative), no blood in urine.  CT of abdomen/pelvis showed enlarged myomatous uterus.  Further characterization with MR demonstrated cystic endometrial hyperplasia versus multiple polyps -however, these findings cannot be distinguished with endometrial cancer and further assessment with endometrial sampling was recommended.  Multiple fibroids were also present in the  uterus, with uterus size increased from 2015. * Status post transfusion 3 units PRBCs, Feraheme administered yesterday.  Hemoglobin stable this morning at 8.1 (8.1 yesterday).  Patient hemodynamically stable, asymptomatic. * Discussed patient with gynecologist Dr. Arlina Robes yesterday. Dr. Rip Harbour will see patient as outpatient -per his request sent staff message with patient information so that he will be able to facilitate follow-up appointment.  Will provide patient with contact information at discharge. * Will recommend PCP follow-up within the week to repeat CBC and ensure hemoglobin is stable. * Folic acid 1 mg daily, ferrous sulfate 25 mg 3 times daily with meals, vitamin B12 250 mcg daily, multivitamin daily * PT evaluated patient, no no further physical therapy or equipment needs.  Diet: Regular DVT Ppx: SCDs Dispo: Anticipated discharge today  Jeanmarie Hubert, MD 01/24/2019, 7:48 AM Pager: [phone]

## 2019-01-26 LAB — TYPE AND SCREEN
ABO/RH(D): B POS
Antibody Screen: POSITIVE
Donor AG Type: NEGATIVE
Donor AG Type: NEGATIVE
Donor AG Type: NEGATIVE
Donor AG Type: NEGATIVE
PT AG Type: NEGATIVE
Unit division: 0
Unit division: 0
Unit division: 0
Unit division: 0

## 2019-01-26 LAB — BPAM RBC
Blood Product Expiration Date: 202010072359
Blood Product Expiration Date: 202010192359
Blood Product Expiration Date: 202010252359
Blood Product Expiration Date: 202010272359
ISSUE DATE / TIME: 202009242121
ISSUE DATE / TIME: 202009250325
ISSUE DATE / TIME: 202009250607
Unit Type and Rh: 7300
Unit Type and Rh: 7300
Unit Type and Rh: 7300
Unit Type and Rh: 7300

## 2019-01-26 LAB — PATHOLOGIST SMEAR REVIEW

## 2019-01-26 NOTE — Discharge Summary (Signed)
Name: Rebecca Payne MRN: [id number] DOB: [birth year] 43 y.o. PCP: Patient, No Pcp Per  Date of Admission: 01/22/2019  9:46 AM Date of Discharge: 01/24/2019 Attending Physician: Dr. Dareen Piano  Discharge Diagnosis: 1. Symptomatic anemia 2. Uterine leiomyomata 3. Mild hydronephrosis secondary to mass effect of enlarged uterus on ureters  Discharge Medications: Allergies as of 01/24/2019      Reactions   Influenza Vaccines    Made gums and face swell   Lasix [furosemide] Nausea Only   "took with potassium and had an episode with her vision "   Codeine Swelling, Rash      Medication List    TAKE these medications   ferrous sulfate 325 (65 FE) MG tablet Take 1 tablet (325 mg total) by mouth 3 (three) times daily with meals.   folic acid 1 MG tablet Commonly known as: FOLVITE Take 1 tablet (1 mg total) by mouth daily.   multivitamin with minerals Tabs tablet Take 1 tablet by mouth daily.   vitamin B-12 250 MCG tablet Commonly known as: CYANOCOBALAMIN Take 1 tablet (250 mcg total) by mouth daily.       Disposition and follow-up:   [name] was discharged from [facility] in Stable condition.  At the hospital follow up visit please address:  1.  Please evaluate patient for anemia symptoms, check CBC.  Please ensure patient has follow-up with gynecology for enlarged uterus which is likely the source of her bleeding.  2.  Labs / imaging needed at time of follow-up: CBC  3.  Pending labs/ test needing follow-up: None  Follow-up Appointments: Follow-up Information    [name], MD. Schedule an appointment as soon as possible for a visit.   Specialty: Obstetrics and Gynecology Why: Please call on Monday for an appointment with Dr. Rip Harbour as soon as possible. Dr. Rip Harbour is aware that you will be calling to schedule an appointment. Contact information: [place] [address] [phone]        [name], [place].  Schedule an appointment as soon as possible for a visit in 1 week(s).   Specialty: Family Medicine Why: At this appointment, they will need to check your hemoglobin to make sure you do not have worsening of your anemia. Contact information: [place] [address] [phone]           Hospital Course by problem list:  # Enlarged uterus: # Symptomatic anemia: Hemoglobin of 4.6 on presentation with lab findings consistent with iron deficiency anemia with iron 20, ferritin 4, MCV 58.9, RDW 29.2, reticulocyte count inappropriately low, TIBC within normal limits, minimally symptomatic with patient feeling tired. Patient was without apparent dietary deficiencies and no source of bleeding identified on history.  There is no evidence for hemolysis with normal bilirubin.  POC occult blood was negative.  Patient did have history of similar level of anemia in 2015 but that occurred in the context of heavy vaginal bleeding and von Willebrand panel at that time was unremarkable.  Patient without history of liver disease and INR of 1.2.  Patient was transfused 3 units, given Feraheme 510 mg x 1. Patient started on folic acid, ferrous sulfate, vitamin B12, multivitamin supplementation which were continued at discharge.  Hemoglobin stable at 8.1 on day of discharge.  Pelvic ultrasound showed several large uterine fibroids, CT abdomen/pelvis with contrast showed enlarged myomatous uterus and recommendation was for MRI for better evaluation of uterus/endometrium.  CT also demonstrated  mild bilateral hydronephrosis secondary to compression of distal ureters by myomatous uterus.  MR of the abdomen/pelvis showed cystic endometrial changes with endometrial thickening which could be secondary to cystic endometrial hyperplasia/multiple polyps but further evaluation with endometrial sample was recommended to rule out cancer.  Patient's chronic blood loss may be secondary to intrauterine bleeding.   Case was discussed with gynecologist Dr. Arlina Robes and staff message sent per his request so that he would be able to help facilitate follow-up appointment.  Patient was also provided contact for gynecologist to schedule follow-up.  Discharge Vitals:   BP 118/78 (BP Location: Right Arm)   Pulse 90   Temp 97.7 F (36.5 C) (Oral)   Resp 19   Ht 5\' 4"  (1.626 m)   Wt 98.4 kg   SpO2 100%   BMI 37.25 kg/m   Pertinent Labs, Studies, and Procedures:  US Pelvic Transvaginal Ultrasound (01/22/2019): IMPRESSION: Enlarged uterus withseveral large uterine fibroids identified. The largest measures 13 cm.  Left ovarian cyst, likely follicular in nature.  CT Abdomen/Pelvis W Contrast (01/23/2019): IMPRESSION: 1. Enlarged myomatous uterus with increase in size since the prior CT. MRI may provide better evaluation of the uterus and endometrium. 2. Mild bilateral hydronephrosis secondary to mass effect and compression of the distal ureters by the myomatous uterus. 3. Cholelithiasis. 4. No bowel obstruction or active inflammation. Normal appendix. 5. Cardiomegaly with mild vascular congestion.  MR Abdomen/Pelvis W WO Contrast (01/23/2019): IMPRESSION: *Cystic endometrial changes with endometrial thickening may represent cystic endometrial hyperplasia or multiple polyps. Based on imaging appearance this cannot be distinguished reliably from endometrial cancer, consider further assessment with endometrial sampling. *Multiple leiomyomata throughout an enlarged uterus, uterus larger than in 2015 but with marked enlargement previously. *No signs of pelvic adenopathy. *Please refer to CT evaluation for abdominal visceral findings.  Discharge Instructions: Discharge Instructions    Diet - low sodium heart healthy   Complete by: As directed    Discharge instructions   Complete by: As directed    You were seen in the hospital for severe anemia.  You were given a blood transfusion as well as  vitamins to help your body make its own blood. After the transfusions, we checked your blood levels and they are now stable. We think that you are losing blood into your enlarged uterus. It is important that you followup with the gynecology doctors to have them further evaluate this. Please call them on Monday at the provided number in your discharge paperwork. Given the appearance of your uterus on MRI, the gynecology doctors will also need to assess for abnormal growth of your uterus.  Please also follow-up with your primary care doctor within the next week.  They will need to check your blood levels to make sure that they remain stable.  We have provided you prescriptions for iron, folic acid, vitamin 123456, and a multivitamin.  It is important that you take the supplements to help your body to make its own blood.   Increase activity slowly   Complete by: As directed       Signed: Jeanmarie Hubert, MD 01/26/2019, 1:32 PM   Pager: [phone]

## 2019-02-23 ENCOUNTER — Ambulatory Visit (INDEPENDENT_AMBULATORY_CARE_PROVIDER_SITE_OTHER): Payer: BLUE CROSS/BLUE SHIELD | Admitting: Obstetrics and Gynecology

## 2019-02-23 ENCOUNTER — Other Ambulatory Visit: Payer: Self-pay

## 2019-02-23 ENCOUNTER — Encounter: Payer: Self-pay | Admitting: Obstetrics and Gynecology

## 2019-02-23 DIAGNOSIS — D219 Benign neoplasm of connective and other soft tissue, unspecified: Secondary | ICD-10-CM

## 2019-02-23 HISTORY — DX: Benign neoplasm of connective and other soft tissue, unspecified: D21.9

## 2019-02-23 MED ORDER — LEUPROLIDE ACETATE (3 MONTH) 11.25 MG IM KIT
11.2500 mg | PACK | Freq: Once | INTRAMUSCULAR | Status: AC
  Administered 2019-02-23: 11.25 mg via INTRAMUSCULAR

## 2019-02-23 NOTE — Patient Instructions (Signed)
Abdominal Hysterectomy Abdominal hysterectomy is a surgery to remove the womb. The womb is also called the uterus. The womb is the body part that holds a growing baby. This surgery may be done if you have certain problems of the womb. These may include cancer or growths in your womb. Other problems include infection, long-term pain, very bad bleeding, or other problems with your monthly period. The procedure may also be done if:  Your womb has slipped down into your vagina.  You have a condition in which the tissue that lines the womb grows outside of its normal place. You may also need other parts removed that are used for creating babies. This will depend on why you need to have the surgery. These parts could include:  The lowest part of the womb, which opens into the vagina.  The parts that make eggs.  The tubes that connect the womb to the parts that make eggs. Tell your doctor about:  Any allergies you have.  All medicines you are taking, including vitamins, herbs, eye drops, creams, and over-the-counter medicines.  Any problems you or family members have had with medicines that make you fall asleep (anesthetic medicines).  Any blood disorders you have.  Any surgeries you have had.  Any medical conditions you have.  Whether you are pregnant or may be pregnant. What are the risks? Generally, this is a safe surgery. However, problems may occur, including:  Bleeding.  Infection.  Allergic reactions to medicines or dyes.  Damage to nearby parts.  Nerve injury.  Having less interest in sex or pain during sex.  Clumps of blood (clots) that can break free and move to your lungs. What happens before the procedure? Staying hydrated Follow instructions from your doctor about hydration. These may include:  Up to 2 hours before the procedure - you may continue to drink clear liquids. These include water, clear fruit juice, black coffee, and plain tea. Eating and drinking  restrictions Follow instructions from your doctor about eating and drinking. These may include:  8 hours before the procedure - stop eating heavy meals or foods. These include meat, fried foods, or fatty foods.  6 hours before the procedure - stop eating light meals or foods. These include toast or cereal.  6 hours before the procedure - stop drinking milk or drinks that contain milk.  2 hours before the procedure - stop drinking clear liquids. Medicines  Ask your doctor about: ? Changing or stopping your normal medicines. This is important. ? Taking aspirin and ibuprofen. Do not take these medicines unless your doctor tells you to take them. ? Taking over-the-counter medicines, vitamins, herbs, and supplements.  You may be asked to take a medicine that is used for trouble pooping (constipation). Surgery safety Ask your doctor:  How your surgery site will be marked.  What steps will be taken to help prevent the spread of germs. These steps may include: ? Removing hair at the surgery site. ? Washing skin with a germ-killing soap. ? Taking antibiotic medicine. General instructions  Talk to your doctor about the changes this procedure may cause. These can affect your body and your feelings.  You may have an exam or testing. You may have a blood or pee (urine) sample taken.  Do not use any products that contain nicotine or tobacco before the procedure. This includes cigarettes, e-cigarettes, and chewing tobacco. Do this for at least 4 weeks. If you need help quitting, ask your doctor.  You may need  to have an enema to clean out your butt and lower colon.  Plan to have someone take you home from the hospital or clinic. What happens during the procedure?  An IV tube will be inserted into one of your veins.  You may be given: ? A medicine to help you relax (sedative). ? A medicine to make you fall asleep.  Tight-fitting stockings will be placed on your legs to help with blood  flow.  A thin, flexible tube will be placed to help drain your pee.  A cut (incision) will be made through the skin in your lower belly. It may go side-to-side or up-and-down.  The body tissue that covers your womb will be moved aside. Your womb and any other parts that need to be removed will be carefully taken out.  Bleeding will be controlled with clamps or stitches (sutures).  Your cut will be closed with stitches, skin glue, or skin tape (adhesive).  A bandage (dressing) will be placed over the cut. The procedure may vary among doctors and hospitals. What happens after the procedure?  You will be monitored until you leave the hospital or clinic. This includes checking your blood pressure, heart rate, breathing rate, and blood oxygen level.  You will be given pain medicine if you need it.  You will need to stay in the hospital for 1-2 days. Ask your doctor how long you will need to stay in the hospital after your procedure.  You may have a liquid diet at first. You will most likely go back to your normal diet the day after surgery.  You will still have the tube in place for pee. The tube will likely be taken out the day after surgery.  You may have to wear tight-fitting stockings. These stockings help to prevent clumps of blood and reduce swelling in your legs.  You will be urged to walk as soon as possible. You will also use a machine (device) or do breathing exercises to keep your lungs clear.  You may need to use a pad in your underwear for fluids that come from your vagina. Summary  Abdominal hysterectomy is a surgery to remove your womb. The womb is the body part that holds a growing baby.  Talk to your doctor about the changes this procedure may cause. These can affect your body and your feelings.  You will be given pain medicine if you need it.  You will need to stay in the hospital for 1-2 days. Ask your doctor how long you will need to stay in the hospital after  your procedure. This information is not intended to replace advice given to you by your health care provider. Make sure you discuss any questions you have with your health care provider. Document Released: 04/21/2013 Document Revised: 06/19/2018 Document Reviewed: 04/04/2016 Elsevier Patient Education  [place]. Abdominal Hysterectomy, Care After This sheet gives you information about how to care for yourself after your procedure. Your health care provider may also give you more specific instructions. If you have problems or questions, contact your health care provider. What can I expect after the procedure? After the procedure, it is common to have:  Pain.  Tiredness (fatigue).  Poor appetite.  Lowered interest in sex.  Bleeding and discharge from your vagina. You may need to use a pad in your underwear after this procedure. Follow these instructions at home: Medicines  Take over-the-counter and prescription medicines only as told by your doctor.  Do not take  aspirin or ibuprofen. These medicines can cause bleeding.  Ask your doctor if the medicine prescribed to you: ? Requires you to avoid driving or using heavy machinery. ? Can cause trouble pooping (constipation). You may need to take these actions to prevent or treat trouble pooping:  Take over-the-counter or prescription medicines.  Eat foods that are high in fiber. These include beans, whole grains, and fresh fruits and vegetables.  Limit foods that are high in fat and processed sugars. These include fried or sweet foods. Surgical cut (incision) care      Follow instructions from your doctor about how to take care of your cut from surgery (incision). Make sure you: ? Wash your hands with soap and water before and after you change your bandage (dressing). If you cannot use soap and water, use hand sanitizer. ? Change your bandage as told by your doctor. ? Leave stitches (sutures), skin glue, or skin tape  (adhesive) strips in place. They may need to stay in place for 2 weeks or longer. If tape strips get loose and curl up, you may trim the loose edges. Do not remove tape strips completely unless your doctor says it is okay.  Check your cut from surgery every day for signs of infection. Check for: ? Redness, swelling, or pain. ? Fluid or blood. ? Warmth. ? Pus or a bad smell. Activity  Rest as told by your doctor. ? Do not sit for a long time without moving. Get up to take short walks every 1-2 hours. This is important. Ask for help if you feel weak or unsteady.  Do not lift anything that is heavier than 10 lb (4.5 kg), or the limit that you are told, until your doctor says that it is safe.  Do not drive or use heavy machinery while taking prescription pain medicine.  Follow your doctor's advice about exercise, driving, and general activities. Return to your normal activities as told by your doctor. Ask your doctor what activities are safe for you. Lifestyle  Do not douche, use tampons, or have sex for at least 6 weeks or as told by your doctor.  Do not drink alcohol until your doctor says it is okay.  Do not use any products that contain nicotine or tobacco, such as cigarettes, e-cigarettes, and chewing tobacco. If you need help quitting, ask your doctor. General instructions   Drink enough fluid to keep your pee (urine) pale yellow.  Do not take baths, swim, or use a hot tub until your doctor approves. Ask your doctor if you may take showers. You may only be allowed to take sponge baths.  Try to have someone at home with you for the first 1-2 weeks to help you with your daily chores at home.  Keep the bandage dry until your doctor says it can be taken off.  Wear tight-fitting (compression) stockings as told by your health care provider. These stockings help to prevent blood clots and reduce swelling in your legs.  Keep all follow-up visits as told by your doctor. This is  important. Contact a doctor if:  You have any of these signs of infection: ? Redness, swelling, or pain around your cut. ? Fluid or blood coming from your cut. ? Warmth coming from your cut. ? Pus or a bad smell coming from your cut. ? Chills or a fever.  Your cut breaks open.  You feel dizzy or light-headed.  You have pain or bleeding when you pee.  You keep having  watery poop (diarrhea).  You keep feeling like you may vomit (nauseous) or keep vomiting.  You have unusual fluid (discharge) coming from your vagina.  You have a rash.  You have any type of reaction to your medicine that is not normal, or you develop an allergy to your medicine.  Your pain medicine does not help. Get help right away if:  You have a fever and your symptoms get worse all of a sudden.  You have very bad pain in your belly (abdomen).  You are short of breath.  You faint.  You have pain, swelling, or redness of your leg.  You bleed a lot from your vagina and notice clumps of blood (clots). Summary  Do not take baths, swim, or use a hot tub until your doctor approves. Ask your doctor if you may take showers. You may only be allowed to take sponge baths.  Do not lift anything that is heavier than 10 lb (4.5 kg), or the limit that you are told, until your doctor says that it is safe.  Follow your doctor's advice about exercise, driving, and general activities. Ask your doctor what activities are safe for you.  Try to have someone at home with you for the first 1-2 weeks to help you with your daily chores at home. This information is not intended to replace advice given to you by your health care provider. Make sure you discuss any questions you have with your health care provider. Document Released: 01/24/2008 Document Revised: 06/19/2018 Document Reviewed: 04/04/2016 Elsevier Patient Education  [place]. Leuprolide depot injection What is this medicine? LEUPROLIDE (loo PROE lide)  is a man-made protein that acts like a natural hormone in the body. It decreases testosterone in men and decreases estrogen in women. In men, this medicine is used to treat advanced prostate cancer. In women, some forms of this medicine may be used to treat endometriosis, uterine fibroids, or other female hormone-related problems. This medicine may be used for other purposes; ask your health care provider or pharmacist if you have questions. COMMON BRAND NAME(S): Eligard, Fensolv, Lupron Depot, Lupron Depot-Ped, Viadur What should I tell my health care provider before I take this medicine? They need to know if you have any of these conditions:  diabetes  heart disease or previous heart attack  high blood pressure  high cholesterol  mental illness  osteoporosis  pain or difficulty passing urine  seizures  spinal cord metastasis  stroke  suicidal thoughts, plans, or attempt; a previous suicide attempt by you or a family member  tobacco smoker  unusual vaginal bleeding (women)  an unusual or allergic reaction to leuprolide, benzyl alcohol, other medicines, foods, dyes, or preservatives  pregnant or trying to get pregnant  breast-feeding How should I use this medicine? This medicine is for injection into a muscle or for injection under the skin. It is given by a health care professional in a hospital or clinic setting. The specific product will determine how it will be given to you. Make sure you understand which product you receive and how often you will receive it. Talk to your pediatrician regarding the use of this medicine in children. Special care may be needed. Overdosage: If you think you have taken too much of this medicine contact a poison control center or emergency room at once. NOTE: This medicine is only for you. Do not share this medicine with others. What if I miss a dose? It is important not to miss a  dose. Call your doctor or health care professional if you are  unable to keep an appointment. Depot injections: Depot injections are given either once-monthly, every 12 weeks, every 16 weeks, or every 24 weeks depending on the product you are prescribed. The product you are prescribed will be based on if you are female or female, and your condition. Make sure you understand your product and dosing. What may interact with this medicine? Do not take this medicine with any of the following medications:  chasteberry This medicine may also interact with the following medications:  herbal or dietary supplements, like black cohosh or DHEA  female hormones, like estrogens or progestins and birth control pills, patches, rings, or injections  female hormones, like testosterone This list may not describe all possible interactions. Give your health care provider a list of all the medicines, herbs, non-prescription drugs, or dietary supplements you use. Also tell them if you smoke, drink alcohol, or use illegal drugs. Some items may interact with your medicine. What should I watch for while using this medicine? Visit your doctor or health care professional for regular checks on your progress. During the first weeks of treatment, your symptoms may get worse, but then will improve as you continue your treatment. You may get hot flashes, increased bone pain, increased difficulty passing urine, or an aggravation of nerve symptoms. Discuss these effects with your doctor or health care professional, some of them may improve with continued use of this medicine. Female patients may experience a menstrual cycle or spotting during the first months of therapy with this medicine. If this continues, contact your doctor or health care professional. This medicine may increase blood sugar. Ask your healthcare provider if changes in diet or medicines are needed if you have diabetes. What side effects may I notice from receiving this medicine? Side effects that you should report to your doctor  or health care professional as soon as possible:  allergic reactions like skin rash, itching or hives, swelling of the face, lips, or tongue  breathing problems  chest pain  depression or memory disorders  pain in your legs or groin  pain at site where injected or implanted  seizures  severe headache  signs and symptoms of high blood sugar such as being more thirsty or hungry or having to urinate more than normal. You may also feel very tired or have blurry vision  swelling of the feet and legs  suicidal thoughts or other mood changes  visual changes  vomiting Side effects that usually do not require medical attention (report to your doctor or health care professional if they continue or are bothersome):  breast swelling or tenderness  decrease in sex drive or performance  diarrhea  hot flashes  loss of appetite  muscle, joint, or bone pains  nausea  redness or irritation at site where injected or implanted  skin problems or acne This list may not describe all possible side effects. Call your doctor for medical advice about side effects. You may report side effects to FDA at 1-800-FDA-1088. Where should I keep my medicine? This drug is given in a hospital or clinic and will not be stored at home. NOTE: This sheet is a summary. It may not cover all possible information. If you have questions about this medicine, talk to your doctor, pharmacist, or health care provider.  2020 Elsevier/Gold Standard (2018-02-13 09:27:03)

## 2019-02-24 LAB — POCT PREGNANCY, URINE: Preg Test, Ur: NEGATIVE

## 2019-02-24 NOTE — Progress Notes (Signed)
Ms [place]el[place] presents for hospital f/u. Pt was found to be severely anemic at her yearly exam with her PCP in Sept. Hgh 4.6. She reported no Sx at that time. Was admitted and transfused. W/U including U/S, CT scan and MRI demonstrated enlarged fibroid uterus ( largest 13 x 9 x 9, at least 20 in total) Hydronephrosis was also noted with normal renal function She reports regular monthly cycles, lasting 3-5 days, no heavy or painful. She denies any pain today. She reports noted a fullness in her abd over the last few months and had been exercising and dieting without any change. She works 2 jobs without problems.  H/O first trimester SAB in the past.  Not sexual active at present  H/O asthma, controlled and managed by PCP  Pap 12/2018, normal  Denies any CP, SOB, bowel or bladder dysfunction  PE AF VSS Lungs clear Heart RRR Abd soft abd/pelvic mass effected noted, @ [redacted] weeks gestation, non tender GU nl EGBUS, unable to visual cervix, able to palpate on bimanual exam, deviated to the left and anterior, markedly enlarge uterus, no anexal masses but exam limited by uterine size  EMBX was obtained but unable to visualize the cervix  A/P Fibroid uterus         Anemia  PCP has pt on vitamins and iron for anemia. Discussed uterine fibroids with pt. D/t to pt's age, size and number of fibroids plus hydronephrosis. TAH recommended to pt. Will give dose of Depo Lupron today.  U/R/B reviewed with pt. Will have pt return in 4-6 weeks to gage response to Depo Lupron. Plan for surgery @ 12 weeks after Lupron injection.

## 2019-04-02 ENCOUNTER — Encounter: Payer: Self-pay | Admitting: Obstetrics and Gynecology

## 2019-04-02 ENCOUNTER — Ambulatory Visit (INDEPENDENT_AMBULATORY_CARE_PROVIDER_SITE_OTHER): Payer: BLUE CROSS/BLUE SHIELD | Admitting: Obstetrics and Gynecology

## 2019-04-02 ENCOUNTER — Other Ambulatory Visit: Payer: Self-pay

## 2019-04-02 VITALS — BP 117/82 | HR 111 | Ht 64.0 in | Wt 220.1 lb

## 2019-04-02 DIAGNOSIS — D219 Benign neoplasm of connective and other soft tissue, unspecified: Secondary | ICD-10-CM

## 2019-04-02 DIAGNOSIS — N920 Excessive and frequent menstruation with regular cycle: Secondary | ICD-10-CM

## 2019-04-02 NOTE — Patient Instructions (Signed)
Abdominal Hysterectomy, Care After This sheet gives you information about how to care for yourself after your procedure. Your health care provider may also give you more specific instructions. If you have problems or questions, contact your health care provider. What can I expect after the procedure? After the procedure, it is common to have:  Pain.  Tiredness (fatigue).  Poor appetite.  Lowered interest in sex.  Bleeding and discharge from your vagina. You may need to use a pad in your underwear after this procedure. Follow these instructions at home: Medicines  Take over-the-counter and prescription medicines only as told by your doctor.  Do not take aspirin or ibuprofen. These medicines can cause bleeding.  Ask your doctor if the medicine prescribed to you: ? Requires you to avoid driving or using heavy machinery. ? Can cause trouble pooping (constipation). You may need to take these actions to prevent or treat trouble pooping:  Take over-the-counter or prescription medicines.  Eat foods that are high in fiber. These include beans, whole grains, and fresh fruits and vegetables.  Limit foods that are high in fat and processed sugars. These include fried or sweet foods. Surgical cut (incision) care      Follow instructions from your doctor about how to take care of your cut from surgery (incision). Make sure you: ? Wash your hands with soap and water before and after you change your bandage (dressing). If you cannot use soap and water, use hand sanitizer. ? Change your bandage as told by your doctor. ? Leave stitches (sutures), skin glue, or skin tape (adhesive) strips in place. They may need to stay in place for 2 weeks or longer. If tape strips get loose and curl up, you may trim the loose edges. Do not remove tape strips completely unless your doctor says it is okay.  Check your cut from surgery every day for signs of infection. Check for: ? Redness, swelling, or pain. ?  Fluid or blood. ? Warmth. ? Pus or a bad smell. Activity  Rest as told by your doctor. ? Do not sit for a long time without moving. Get up to take short walks every 1-2 hours. This is important. Ask for help if you feel weak or unsteady.  Do not lift anything that is heavier than 10 lb (4.5 kg), or the limit that you are told, until your doctor says that it is safe.  Do not drive or use heavy machinery while taking prescription pain medicine.  Follow your doctor's advice about exercise, driving, and general activities. Return to your normal activities as told by your doctor. Ask your doctor what activities are safe for you. Lifestyle  Do not douche, use tampons, or have sex for at least 6 weeks or as told by your doctor.  Do not drink alcohol until your doctor says it is okay.  Do not use any products that contain nicotine or tobacco, such as cigarettes, e-cigarettes, and chewing tobacco. If you need help quitting, ask your doctor. General instructions   Drink enough fluid to keep your pee (urine) pale yellow.  Do not take baths, swim, or use a hot tub until your doctor approves. Ask your doctor if you may take showers. You may only be allowed to take sponge baths.  Try to have someone at home with you for the first 1-2 weeks to help you with your daily chores at home.  Keep the bandage dry until your doctor says it can be taken off.  Wear tight-fitting (  compression) stockings as told by your health care provider. These stockings help to prevent blood clots and reduce swelling in your legs.  Keep all follow-up visits as told by your doctor. This is important. Contact a doctor if:  You have any of these signs of infection: ? Redness, swelling, or pain around your cut. ? Fluid or blood coming from your cut. ? Warmth coming from your cut. ? Pus or a bad smell coming from your cut. ? Chills or a fever.  Your cut breaks open.  You feel dizzy or light-headed.  You have pain  or bleeding when you pee.  You keep having watery poop (diarrhea).  You keep feeling like you may vomit (nauseous) or keep vomiting.  You have unusual fluid (discharge) coming from your vagina.  You have a rash.  You have any type of reaction to your medicine that is not normal, or you develop an allergy to your medicine.  Your pain medicine does not help. Get help right away if:  You have a fever and your symptoms get worse all of a sudden.  You have very bad pain in your belly (abdomen).  You are short of breath.  You faint.  You have pain, swelling, or redness of your leg.  You bleed a lot from your vagina and notice clumps of blood (clots). Summary  Do not take baths, swim, or use a hot tub until your doctor approves. Ask your doctor if you may take showers. You may only be allowed to take sponge baths.  Do not lift anything that is heavier than 10 lb (4.5 kg), or the limit that you are told, until your doctor says that it is safe.  Follow your doctor's advice about exercise, driving, and general activities. Ask your doctor what activities are safe for you.  Try to have someone at home with you for the first 1-2 weeks to help you with your daily chores at home. This information is not intended to replace advice given to you by your health care provider. Make sure you discuss any questions you have with your health care provider. Document Released: 01/24/2008 Document Revised: 06/19/2018 Document Reviewed: 04/04/2016 Elsevier Patient Education  2020 [employer]. Hysterectomy Information  A hysterectomy is a surgery to remove your uterus. After surgery, you will no longer have periods. Also, you will no longer be able to get pregnant. Reasons for this surgery You may have this surgery if:  You have bleeding in your vagina: ? That is not normal. ? That does not stop, or that keeps coming back.  You have long-term (chronic) pain in your lower belly (pelvic area).   The lining of your uterus grows outside of the uterus (endometriosis).  The lining of your uterus grows in the muscle of the uterus (adenomyosis).  Your uterus falls down into your vagina (prolapse).  You have a growth in your uterus that causes problems (uterine fibroids).  You have cells that could turn into cancer (precancerous cells).  You have cancer of the uterus or cervix. Types of hysterectomies There are 3 types of hysterectomies. Depending on the type, the surgery will:  Remove the top part of the uterus (supracervical).  Remove the uterus and the cervix (total).  Remove the uterus, cervix, and tissue that holds the uterus in place (radical). Ways a hysterectomy can be done This surgery may be done in one of these ways:  A cut (incision) is made in the belly (abdomen). The uterus is taken  out through the cut.  A cut is made in the vagina. The uterus is taken out through the cut.  Three or four cuts are made in the belly. A device with a camera is put through one of the cuts. The uterus is cut into pieces and taken out through the cuts or the vagina.  Three or four cuts are made in the belly. A device with a camera is put through one of the cuts. The uterus is taken out through the vagina.  Three or four cuts are made in the belly. A computer helps control the surgical tools. The uterus is cut into small pieces. The pieces are taken out through the cuts or through the vagina. Talk with your doctor about which way is best for you. Risks of hysterectomy Generally, this surgery is safe. However, problems can happen, including:  Bleeding.  Needing donated blood (transfusion).  Blood clots.  Infection.  Damage to other structures or organs.  Allergic reactions.  Needing to switch to a different type of surgery. What to expect after surgery  You will be given pain medicine.  You will need to stay in the hospital for 1-2 days.  Follow your doctor's instructions  about: ? Exercising. ? Driving. ? What activities are safe for you.  You will need to have someone with you at home for 3-5 days.  You will need to see your doctor after 2-4 weeks.  You may get hot flashes, have night sweats, and have trouble sleeping.  You may need to have Pap tests if your surgery was related to cancer. Talk with your doctor about how often you need Pap tests. Questions to ask your doctor  Do I need this surgery? Do I have other treatment options?  What are my options for this surgery?  What needs to be removed?  What are the risks?  What are the benefits?  How long will I need to stay in the hospital?  How long will I need to recover?  What symptoms can I expect after the procedure? Summary  A hysterectomy is a surgery to remove your uterus. After surgery, you will no longer have periods. Also, you will no longer be able to get pregnant.  Talk with your doctor about which type of hysterectomy is best for you. This information is not intended to replace advice given to you by your health care provider. Make sure you discuss any questions you have with your health care provider. Document Released: 07/09/2011 Document Revised: 06/19/2018 Document Reviewed: 07/17/2016 Elsevier Patient Education  2020 [employer].

## 2019-04-02 NOTE — Progress Notes (Signed)
Ms Pale is her for follow up of her uterine fibroids. She received Depo Lupron x 1 last month Cycle last month was shorter and not as heavy  PE AF VSS Lungs clear Heart RRR Abd soft mass affect unchanged  A/P Uterine fibroids        Menorrhagia TAH/BS discussed with pt. R/B/Post op care reviewed Will check GYN U/S in 1 month Schedule surgery after mid Jan F/U with Post op visit

## 2019-04-10 ENCOUNTER — Telehealth: Payer: Self-pay | Admitting: Lactation Services

## 2019-04-10 ENCOUNTER — Telehealth: Payer: Self-pay | Admitting: Obstetrics and Gynecology

## 2019-04-10 NOTE — Telephone Encounter (Signed)
The patient stated ultrasound called her and told her the insurance she is with will not cover the visit and she will have to schedule with someone else. She stated she has found an office to complete the ultrasound in her network- [facility]  Ph. Number [phone] Location- [facility]  Can you change the referral to this office for the patient to be scheduled with an appointment.  Send the message to front desk when completed and we will schedule the appointment.

## 2019-04-10 NOTE — Telephone Encounter (Signed)
Rebecca Payne. Was informed that imaging facilities are in [place] and in [facility]. Called pt Pt agreeable to going to [place]. [facility] reports they cannot see our orders.   [place] facilities are [employer] [phone] phone [place] [facility]  [facility] on [address] [phone] fax  Will need to fax order for US Pelvic Complete Transabdominal/Transvaginal with duplex.   Message to front office to call and schedule pt. Informed pt she will be called once scheduled. She voiced understanding.

## 2019-04-13 ENCOUNTER — Ambulatory Visit (HOSPITAL_COMMUNITY): Admission: RE | Admit: 2019-04-13 | Payer: BLUE CROSS/BLUE SHIELD | Source: Ambulatory Visit

## 2019-05-14 ENCOUNTER — Telehealth: Payer: Self-pay | Admitting: Family Medicine

## 2019-05-14 NOTE — Telephone Encounter (Signed)
Attempted to contact patient to let her know that her FMLA paperwork has been completed and faxed over to her employer. Patient instructed that the $15 payment can be paid at the time of pick-up or her next office visit. No answer, left voicemail with this information.

## 2019-05-27 ENCOUNTER — Telehealth: Payer: Self-pay

## 2019-05-27 NOTE — Telephone Encounter (Signed)
-----   Message from [name], MD sent at 05/27/2019 10:19 AM EST ----- Please let pt know that Katharine Look will be calling to reschedule her surgery due to the new surgery guidelines during Covid.  Please have pt make an appt to see me in Feb.  Thanks  Legrand Como

## 2019-05-27 NOTE — Telephone Encounter (Signed)
Called pt to advise to expect call from [facility] to re-schedule Surgery. And that she will also be notified to schedule appointment with Dr. Rip Harbour in February. Pt verbalized understanding.

## 2019-05-28 DIAGNOSIS — Z029 Encounter for administrative examinations, unspecified: Secondary | ICD-10-CM

## 2019-06-05 ENCOUNTER — Other Ambulatory Visit (HOSPITAL_COMMUNITY): Payer: PRIVATE HEALTH INSURANCE

## 2019-06-05 ENCOUNTER — Encounter (HOSPITAL_COMMUNITY): Payer: PRIVATE HEALTH INSURANCE

## 2019-06-08 ENCOUNTER — Encounter: Payer: Self-pay | Admitting: Obstetrics and Gynecology

## 2019-06-09 ENCOUNTER — Ambulatory Visit (HOSPITAL_BASED_OUTPATIENT_CLINIC_OR_DEPARTMENT_OTHER): Admit: 2019-06-09 | Payer: BLUE CROSS/BLUE SHIELD | Admitting: Obstetrics and Gynecology

## 2019-06-09 ENCOUNTER — Encounter (HOSPITAL_BASED_OUTPATIENT_CLINIC_OR_DEPARTMENT_OTHER): Payer: Self-pay

## 2019-06-09 SURGERY — HYSTERECTOMY, TOTAL, ABDOMINAL, WITH SALPINGECTOMY
Anesthesia: Choice | Laterality: Bilateral

## 2019-06-16 ENCOUNTER — Telehealth: Payer: Self-pay | Admitting: Obstetrics and Gynecology

## 2019-06-16 NOTE — Telephone Encounter (Signed)
Attempted to contact patient to let her know that her FMLA paperwork has been completed and ready for pick-up. No answer, left a voicemail with the above information.

## 2019-07-01 ENCOUNTER — Ambulatory Visit: Payer: PRIVATE HEALTH INSURANCE | Admitting: Obstetrics and Gynecology

## 2019-07-02 ENCOUNTER — Telehealth: Payer: Self-pay | Admitting: *Deleted

## 2019-07-02 NOTE — Telephone Encounter (Signed)
Pt left VM message with questions about her papers for work. She requests a call back. I called pt and she stated that she is having surgery next week and needs to make a payment for the papers which were completed. Pt was given instructions to call our front office staff and they will help her. She voiced understanding.

## 2019-07-02 NOTE — H&P (Signed)
Rebecca Payne is an 44 y.o. female with known enlarged uterus consistent with multiple uterine fibroids. Anemia secondary to excessive menstrual flow, requiring hospitalization and blood transfusion. She desires surgerical intervention. Received Depo Lupron in October in preparation for surgery. Surgery from February had to be rescheduled due to Covid 19.     Menstrual History: Menarche age: 89 No LMP recorded.    Past Medical History:  Diagnosis Date  . Anemia   . Asthma   . Blood transfusion without reported diagnosis     Past Surgical History:  Procedure Laterality Date  . NO PAST SURGERIES      No family history on file.  Social History:  reports that she has never smoked. She has never used smokeless tobacco. She reports that she does not drink alcohol or use drugs.  Allergies:  Allergies  Allergen Reactions  . Influenza Vaccines     Made gums and face swell  . Lasix [Furosemide] Nausea Only    "took with potassium and had an episode with her vision "  . Codeine Swelling and Rash    No medications prior to admission.    Review of Systems  Constitutional: Negative.   Respiratory: Negative.   Cardiovascular: Negative.   Gastrointestinal: Negative.   Genitourinary: Negative.     There were no vitals taken for this visit. Physical Exam  Constitutional: She appears well-developed and well-nourished.  Cardiovascular: Normal rate and regular rhythm.  Respiratory: Effort normal and breath sounds normal.  GI: Soft. Bowel sounds are normal.  Abd/pelvic mass affect @ [redacted] weeks gestational size  Genitourinary:    Genitourinary Comments: Nl EGBUS, enlarged irregular shaped uterus, unable to evaluate adnexal due to uterine size     No results found for this or any previous visit (from the past 24 hour(s)).  No results found.  Assessment/Plan: Enlarger uterus with uterine fibroids Anemia secondary to above  Pt is being admitted for abdominal hysterectomy with  bilateral salpingectomy. R/B/Post op care have been reviewed with pt. She has verbalized understanding and desires to proceed.   Rebecca Payne 07/02/2019, 2:01 PM

## 2019-07-02 NOTE — Progress Notes (Signed)
[place], [address] [place] [address] Phone: [phone] Fax: [phone]      Your procedure is scheduled on Tuesday July 07, 2019  Report to [facility] Main Entrance "A" at 07:30 A.M., and check in at the Admitting office.  Call this number if you have problems the morning of surgery:  [phone]  Call [phone] if you have any questions prior to your surgery date Monday-Friday 8am-4pm    Remember:  Do not eat or drink after midnight the night before your surgery  You may drink clear liquids until 06:30 AM the morning of your surgery.   Clear liquids allowed are: Water, Non-Citrus Juices (without pulp), Carbonated Beverages, Clear Tea, Black Coffee Only, and Gatorade    >> Do not take any medications or vitamins the morning of surgery    As of today, STOP taking any Aspirin (unless otherwise instructed by your surgeon), Aleve, Naproxen, Ibuprofen, Motrin, Advil, Goody's, BC's, all herbal medications, fish oil, and all vitamins including: Cholecalciferol (VITAMIN D3),  ferrous sulfate, folic acid (FOLVITE), Multiple Vitamin, vitamin B-12 (CYANOCOBALAMIN)     The Morning of Surgery  Do not wear jewelry, make-up or nail polish.  Do not wear lotions, powders, perfumes, or deodorant  Do not shave 48 hours prior to surgery.  Do not bring valuables to the hospital.  [facility] is not responsible for any belongings or valuables.  If you are a smoker, DO NOT Smoke 24 hours prior to surgery  If you wear a CPAP at night please bring your mask the morning of surgery   Remember that you must have someone to transport you home after your surgery, and remain with you for 24 hours if you are discharged the same day.   Please bring cases for contacts, glasses, hearing aids, dentures or bridgework because it cannot be worn into surgery.    Leave your suitcase in the car.  After surgery it may be brought to  your room.  For patients admitted to the hospital, discharge time will be determined by your treatment team.  Patients discharged the day of surgery will not be allowed to drive home.    Special instructions:   [place]- Preparing For Surgery  Before surgery, you can play an important role. Because skin is not sterile, your skin needs to be as free of germs as possible. You can reduce the number of germs on your skin by washing with CHG (chlorahexidine gluconate) Soap before surgery.  CHG is an antiseptic cleaner which kills germs and bonds with the skin to continue killing germs even after washing.    Oral Hygiene is also important to reduce your risk of infection.  Remember - BRUSH YOUR TEETH THE MORNING OF SURGERY WITH YOUR REGULAR TOOTHPASTE  Please do not use if you have an allergy to CHG or antibacterial soaps. If your skin becomes reddened/irritated stop using the CHG.  Do not shave (including legs and underarms) for at least 48 hours prior to first CHG shower. It is OK to shave your face.  Please follow these instructions carefully.   1. Shower the NIGHT BEFORE SURGERY and the MORNING OF SURGERY with CHG Soap.   2. If you chose to wash your hair, wash your hair first as usual with your normal shampoo.  3. After you shampoo, rinse your hair and body thoroughly to remove the shampoo.  4. Use CHG as you would any other liquid  soap. You can apply CHG directly to the skin and wash gently with a scrungie or a clean washcloth.   5. Apply the CHG Soap to your body ONLY FROM THE NECK DOWN.  Do not use on open wounds or open sores. Avoid contact with your eyes, ears, mouth and genitals (private parts). Wash Face and genitals (private parts)  with your normal soap.   6. Wash thoroughly, paying special attention to the area where your surgery will be performed.  7. Thoroughly rinse your body with warm water from the neck down.  8. DO NOT shower/wash with your normal soap after using  and rinsing off the CHG Soap.  9. Pat yourself dry with a CLEAN TOWEL.  10. Wear CLEAN PAJAMAS to bed the night before surgery, wear comfortable clothes the morning of surgery  11. Place CLEAN SHEETS on your bed the night of your first shower and DO NOT SLEEP WITH PETS.    Day of Surgery:  Please shower the morning of surgery with the CHG soap Do not apply any deodorants/lotions. Please wear clean clothes to the hospital/surgery center.   Remember to brush your teeth WITH YOUR REGULAR TOOTHPASTE.   Please read over the following fact sheets that you were given.

## 2019-07-02 NOTE — Progress Notes (Signed)
Pt procedure was abbreviated and abbreviations were unclear for informed consent. PAT appt on 3/5, [facility] 3/4, spoke with scheduler, she direct messaged Dr. Rip Harbour to update procedure to be done for patient for informed consent.

## 2019-07-03 ENCOUNTER — Other Ambulatory Visit: Payer: Self-pay

## 2019-07-03 ENCOUNTER — Encounter (HOSPITAL_COMMUNITY): Payer: Self-pay

## 2019-07-03 ENCOUNTER — Other Ambulatory Visit (HOSPITAL_COMMUNITY)
Admission: RE | Admit: 2019-07-03 | Discharge: 2019-07-03 | Disposition: A | Discharge status: Home / Self Care | Payer: BLUE CROSS/BLUE SHIELD | Source: Ambulatory Visit | Attending: Obstetrics and Gynecology | Admitting: Obstetrics and Gynecology

## 2019-07-03 ENCOUNTER — Encounter (HOSPITAL_COMMUNITY)
Admission: RE | Admit: 2019-07-03 | Discharge: 2019-07-03 | Disposition: A | Discharge status: Home / Self Care | Payer: BLUE CROSS/BLUE SHIELD | Source: Ambulatory Visit | Attending: Obstetrics and Gynecology | Admitting: Obstetrics and Gynecology

## 2019-07-03 DIAGNOSIS — Z01810 Encounter for preprocedural cardiovascular examination: Secondary | ICD-10-CM | POA: Diagnosis not present

## 2019-07-03 DIAGNOSIS — Z01812 Encounter for preprocedural laboratory examination: Secondary | ICD-10-CM | POA: Insufficient documentation

## 2019-07-03 DIAGNOSIS — Z20822 Contact with and (suspected) exposure to covid-19: Secondary | ICD-10-CM | POA: Insufficient documentation

## 2019-07-03 LAB — BASIC METABOLIC PANEL
Anion gap: 11 (ref 5–15)
BUN: 7 mg/dL (ref 6–20)
CO2: 24 mmol/L (ref 22–32)
Calcium: 9.3 mg/dL (ref 8.9–10.3)
Chloride: 107 mmol/L (ref 98–111)
Creatinine, Ser: 0.57 mg/dL (ref 0.44–1.00)
GFR calc Af Amer: >60 mL/min (ref >60)
GFR calc non Af Amer: >60 mL/min (ref >60)
Glucose, Bld: 133 mg/dL — ABNORMAL HIGH (ref 70–99)
Potassium: 3.8 mmol/L (ref 3.5–5.1)
Sodium: 142 mmol/L (ref 135–145)

## 2019-07-03 LAB — CBC
HCT: 42.4 % (ref 36.0–46.0)
Hemoglobin: 12.4 g/dL (ref 12.0–15.0)
MCH: 22.1 pg — ABNORMAL LOW (ref 26.0–34.0)
MCHC: 29.2 g/dL — ABNORMAL LOW (ref 30.0–36.0)
MCV: 75.4 fL — ABNORMAL LOW (ref 80.0–100.0)
Platelets: 265 10*3/uL (ref 150–400)
RBC: 5.62 MIL/uL — ABNORMAL HIGH (ref 3.87–5.11)
RDW: 17.1 % — ABNORMAL HIGH (ref 11.5–15.5)
WBC: 4.9 10*3/uL (ref 4.0–10.5)
nRBC: 0 % (ref 0.0–0.2)

## 2019-07-03 LAB — SARS CORONAVIRUS 2 (TAT 6-24 HRS): SARS Coronavirus 2: NEGATIVE

## 2019-07-03 NOTE — Progress Notes (Signed)
PCP - [facility] @ [facility] - denies  Chest x-ray - n/a EKG - n/a  ERAS Protcol - yes, no drink ordered or given   COVID TEST- today   Anesthesia review: n/a  Patient denies shortness of breath, fever, cough and chest pain at PAT appointment   All instructions explained to the patient, with a verbal understanding of the material. Patient agrees to go over the instructions while at home for a better understanding. Patient also instructed to self quarantine after being tested for COVID-19. The opportunity to ask questions was provided.

## 2019-07-06 NOTE — Anesthesia Preprocedure Evaluation (Addendum)
Anesthesia Evaluation  Patient identified by MRN, date of birth, ID band Patient awake    Reviewed: Allergy & Precautions, NPO status , Patient's Chart, lab work & pertinent test results  Airway Mallampati: II  TM Distance: >3 FB Neck ROM: Full    Dental no notable dental hx. (+) Poor Dentition, Dental Advisory Given, Chipped,    Pulmonary asthma ,    Pulmonary exam normal breath sounds clear to auscultation       Cardiovascular Exercise Tolerance: Good negative cardio ROS Normal cardiovascular exam Rhythm:Regular Rate:Normal     Neuro/Psych negative neurological ROS  negative psych ROS   GI/Hepatic negative GI ROS, Neg liver ROS,   Endo/Other  negative endocrine ROS  Renal/GU K+ 3.8 Cr 0.57     Musculoskeletal negative musculoskeletal ROS (+)   Abdominal (+) + obese,   Peds  Hematology  (+) Blood dyscrasia, anemia , Hgb 12.4 plt 265 T&C   Anesthesia Other Findings   Reproductive/Obstetrics                           Anesthesia Physical Anesthesia Plan  ASA: III  Anesthesia Plan: General   Post-op Pain Management:    Induction: Intravenous  PONV Risk Score and Plan: Treatment may vary due to age or medical condition, Dexamethasone, Ondansetron and Midazolam  Airway Management Planned: Oral ETT  Additional Equipment: None  Intra-op Plan:   Post-operative Plan: Extubation in OR  Informed Consent: I have reviewed the patients History and Physical, chart, labs and discussed the procedure including the risks, benefits and alternatives for the proposed anesthesia with the patient or authorized representative who has indicated his/her understanding and acceptance.     Dental advisory given  Plan Discussed with: CRNA  Anesthesia Plan Comments: (GA w TAP block )       Anesthesia Quick Evaluation

## 2019-07-07 ENCOUNTER — Inpatient Hospital Stay (HOSPITAL_COMMUNITY)
Admission: RE | Admit: 2019-07-07 | Discharge: 2019-07-10 | DRG: 742 | Disposition: A | Discharge status: Home / Self Care | Payer: BLUE CROSS/BLUE SHIELD | Attending: Obstetrics and Gynecology | Admitting: Obstetrics and Gynecology

## 2019-07-07 ENCOUNTER — Other Ambulatory Visit: Payer: Self-pay

## 2019-07-07 ENCOUNTER — Encounter (HOSPITAL_COMMUNITY): Payer: Self-pay | Admitting: Obstetrics and Gynecology

## 2019-07-07 ENCOUNTER — Inpatient Hospital Stay (HOSPITAL_COMMUNITY): Payer: BLUE CROSS/BLUE SHIELD

## 2019-07-07 ENCOUNTER — Encounter (HOSPITAL_COMMUNITY)
Admission: RE | Disposition: A | Discharge status: Home / Self Care | Payer: Self-pay | Source: Home / Self Care | Attending: Obstetrics and Gynecology

## 2019-07-07 DIAGNOSIS — Z6841 Body Mass Index (BMI) 40.0 and over, adult: Secondary | ICD-10-CM

## 2019-07-07 DIAGNOSIS — D219 Benign neoplasm of connective and other soft tissue, unspecified: Secondary | ICD-10-CM | POA: Diagnosis present

## 2019-07-07 DIAGNOSIS — Z9889 Other specified postprocedural states: Secondary | ICD-10-CM

## 2019-07-07 DIAGNOSIS — N920 Excessive and frequent menstruation with regular cycle: Secondary | ICD-10-CM | POA: Diagnosis present

## 2019-07-07 DIAGNOSIS — D649 Anemia, unspecified: Secondary | ICD-10-CM | POA: Diagnosis present

## 2019-07-07 DIAGNOSIS — Z20822 Contact with and (suspected) exposure to covid-19: Secondary | ICD-10-CM | POA: Diagnosis present

## 2019-07-07 DIAGNOSIS — D259 Leiomyoma of uterus, unspecified: Secondary | ICD-10-CM | POA: Diagnosis present

## 2019-07-07 DIAGNOSIS — E669 Obesity, unspecified: Secondary | ICD-10-CM | POA: Diagnosis present

## 2019-07-07 DIAGNOSIS — J45909 Unspecified asthma, uncomplicated: Secondary | ICD-10-CM | POA: Diagnosis present

## 2019-07-07 DIAGNOSIS — D252 Subserosal leiomyoma of uterus: Secondary | ICD-10-CM

## 2019-07-07 HISTORY — PX: HYSTERECTOMY ABDOMINAL WITH SALPINGECTOMY: SHX6725

## 2019-07-07 HISTORY — PX: ABDOMINAL HYSTERECTOMY: SHX81

## 2019-07-07 LAB — TYPE AND SCREEN
ABO/RH(D): B POS
Antibody Screen: POSITIVE

## 2019-07-07 LAB — POCT PREGNANCY, URINE: Preg Test, Ur: NEGATIVE

## 2019-07-07 SURGERY — HYSTERECTOMY, TOTAL, ABDOMINAL, WITH SALPINGECTOMY
Anesthesia: General | Site: Abdomen | Laterality: Bilateral

## 2019-07-07 MED ORDER — ZOLPIDEM TARTRATE 5 MG PO TABS: 5.0000 mg | ORAL_TABLET | Freq: Every evening | ORAL | Status: DC | PRN

## 2019-07-07 MED ORDER — ROCURONIUM BROMIDE 10 MG/ML (PF) SYRINGE
PREFILLED_SYRINGE | INTRAVENOUS | Status: AC
  Filled 2019-07-07: qty 10

## 2019-07-07 MED ORDER — ROCURONIUM BROMIDE 100 MG/10ML IV SOLN
INTRAVENOUS | Status: DC | PRN
  Administered 2019-07-07: 60 mg via INTRAVENOUS

## 2019-07-07 MED ORDER — CEFAZOLIN SODIUM-DEXTROSE 2-4 GM/100ML-% IV SOLN
2.0000 g | INTRAVENOUS | Status: AC
  Administered 2019-07-07: 10:00:00 2 g via INTRAVENOUS
  Filled 2019-07-07: qty 100

## 2019-07-07 MED ORDER — HYDROMORPHONE HCL 1 MG/ML IJ SOLN
0.2500 mg | INTRAMUSCULAR | Status: DC | PRN
  Administered 2019-07-07 (×2): 0.25 mg via INTRAVENOUS

## 2019-07-07 MED ORDER — DEXMEDETOMIDINE HCL 200 MCG/2ML IV SOLN
INTRAVENOUS | Status: DC | PRN
  Administered 2019-07-07 (×5): 8 ug via INTRAVENOUS

## 2019-07-07 MED ORDER — SENNA 8.6 MG PO TABS
1.0000 | ORAL_TABLET | Freq: Two times a day (BID) | ORAL | Status: DC
  Administered 2019-07-07 – 2019-07-10 (×7): 8.6 mg via ORAL
  Filled 2019-07-07 (×7): qty 1

## 2019-07-07 MED ORDER — LIDOCAINE 2% (20 MG/ML) 5 ML SYRINGE
INTRAMUSCULAR | Status: AC
  Filled 2019-07-07: qty 5

## 2019-07-07 MED ORDER — PHENYLEPHRINE HCL-NACL 10-0.9 MG/250ML-% IV SOLN
INTRAVENOUS | Status: DC | PRN
  Administered 2019-07-07: 25 ug/min via INTRAVENOUS

## 2019-07-07 MED ORDER — KETOROLAC TROMETHAMINE 30 MG/ML IJ SOLN
INTRAMUSCULAR | Status: DC | PRN
  Administered 2019-07-07: 30 mg via INTRAVENOUS

## 2019-07-07 MED ORDER — ONDANSETRON HCL 4 MG/2ML IJ SOLN: 4.0000 mg | Freq: Once | INTRAMUSCULAR | Status: DC | PRN

## 2019-07-07 MED ORDER — PROPOFOL 10 MG/ML IV BOLUS
INTRAVENOUS | Status: AC
  Filled 2019-07-07: qty 20

## 2019-07-07 MED ORDER — MIDAZOLAM HCL 2 MG/2ML IJ SOLN
INTRAMUSCULAR | Status: AC
  Administered 2019-07-07: 09:00:00 2 mg via INTRAVENOUS
  Filled 2019-07-07: qty 2

## 2019-07-07 MED ORDER — ONDANSETRON HCL 4 MG/2ML IJ SOLN
INTRAMUSCULAR | Status: DC | PRN
  Administered 2019-07-07: 4 mg via INTRAVENOUS

## 2019-07-07 MED ORDER — LACTATED RINGERS IV SOLN: INTRAVENOUS | Status: DC

## 2019-07-07 MED ORDER — HYDROMORPHONE HCL 2 MG PO TABS
2.0000 mg | ORAL_TABLET | ORAL | Status: DC | PRN
  Administered 2019-07-08 – 2019-07-09 (×2): 2 mg via ORAL
  Filled 2019-07-07 (×2): qty 1

## 2019-07-07 MED ORDER — SUGAMMADEX SODIUM 200 MG/2ML IV SOLN
INTRAVENOUS | Status: DC | PRN
  Administered 2019-07-07: 200 mg via INTRAVENOUS

## 2019-07-07 MED ORDER — DEXAMETHASONE SODIUM PHOSPHATE 10 MG/ML IJ SOLN
INTRAMUSCULAR | Status: DC | PRN
  Administered 2019-07-07: 5 mg via INTRAVENOUS

## 2019-07-07 MED ORDER — ONDANSETRON HCL 4 MG PO TABS: 4.0000 mg | ORAL_TABLET | Freq: Four times a day (QID) | ORAL | Status: DC | PRN

## 2019-07-07 MED ORDER — OXYCODONE HCL 5 MG/5ML PO SOLN: 5.0000 mg | Freq: Once | ORAL | Status: DC | PRN

## 2019-07-07 MED ORDER — KETOROLAC TROMETHAMINE 30 MG/ML IJ SOLN: 30.0000 mg | Freq: Once | INTRAMUSCULAR | Status: DC | PRN

## 2019-07-07 MED ORDER — FENTANYL CITRATE (PF) 250 MCG/5ML IJ SOLN
INTRAMUSCULAR | Status: AC
  Filled 2019-07-07: qty 5

## 2019-07-07 MED ORDER — ONDANSETRON HCL 4 MG/2ML IJ SOLN
INTRAMUSCULAR | Status: AC
  Filled 2019-07-07: qty 2

## 2019-07-07 MED ORDER — DEXAMETHASONE SODIUM PHOSPHATE 10 MG/ML IJ SOLN
INTRAMUSCULAR | Status: AC
  Filled 2019-07-07: qty 1

## 2019-07-07 MED ORDER — BUPIVACAINE HCL 0.5 % IJ SOLN
INTRAMUSCULAR | Status: DC | PRN
  Administered 2019-07-07: 30 mL

## 2019-07-07 MED ORDER — FENTANYL CITRATE (PF) 100 MCG/2ML IJ SOLN: 100.0000 ug | Freq: Once | INTRAMUSCULAR | Status: AC

## 2019-07-07 MED ORDER — HYDROMORPHONE HCL 1 MG/ML IJ SOLN
INTRAMUSCULAR | Status: AC
  Filled 2019-07-07: qty 1

## 2019-07-07 MED ORDER — OXYCODONE HCL 5 MG PO TABS: 5.0000 mg | ORAL_TABLET | Freq: Once | ORAL | Status: DC | PRN

## 2019-07-07 MED ORDER — KETOROLAC TROMETHAMINE 30 MG/ML IJ SOLN
INTRAMUSCULAR | Status: AC
  Filled 2019-07-07: qty 1

## 2019-07-07 MED ORDER — KETOROLAC TROMETHAMINE 15 MG/ML IJ SOLN
15.0000 mg | INTRAMUSCULAR | Status: AC
  Filled 2019-07-07: qty 1

## 2019-07-07 MED ORDER — SIMETHICONE 80 MG PO CHEW: 80.0000 mg | CHEWABLE_TABLET | Freq: Four times a day (QID) | ORAL | Status: DC | PRN

## 2019-07-07 MED ORDER — SOD CITRATE-CITRIC ACID 500-334 MG/5ML PO SOLN
30.0000 mL | ORAL | Status: AC
  Administered 2019-07-07: 08:00:00 30 mL via ORAL
  Filled 2019-07-07: qty 30

## 2019-07-07 MED ORDER — DIPHENHYDRAMINE HCL 50 MG/ML IJ SOLN
INTRAMUSCULAR | Status: DC | PRN
  Administered 2019-07-07: 6.25 mg via INTRAVENOUS

## 2019-07-07 MED ORDER — MIDAZOLAM HCL 2 MG/2ML IJ SOLN
INTRAMUSCULAR | Status: AC
  Filled 2019-07-07: qty 2

## 2019-07-07 MED ORDER — METOCLOPRAMIDE HCL 5 MG/ML IJ SOLN
INTRAMUSCULAR | Status: DC | PRN
  Administered 2019-07-07: 10 mg via INTRAVENOUS

## 2019-07-07 MED ORDER — LIDOCAINE 2% (20 MG/ML) 5 ML SYRINGE
INTRAMUSCULAR | Status: DC | PRN
  Administered 2019-07-07: 60 mg via INTRAVENOUS

## 2019-07-07 MED ORDER — ACETAMINOPHEN 500 MG PO TABS
1000.0000 mg | ORAL_TABLET | ORAL | Status: AC
  Administered 2019-07-07: 08:00:00 1000 mg via ORAL
  Filled 2019-07-07: qty 2

## 2019-07-07 MED ORDER — BUPIVACAINE HCL (PF) 0.5 % IJ SOLN
INTRAMUSCULAR | Status: AC
  Filled 2019-07-07: qty 30

## 2019-07-07 MED ORDER — PROPOFOL 10 MG/ML IV BOLUS
INTRAVENOUS | Status: DC | PRN
  Administered 2019-07-07 (×2): 50 mg via INTRAVENOUS

## 2019-07-07 MED ORDER — MIDAZOLAM HCL 2 MG/2ML IJ SOLN: 2.0000 mg | Freq: Once | INTRAMUSCULAR | Status: AC

## 2019-07-07 MED ORDER — ONDANSETRON HCL 4 MG/2ML IJ SOLN
4.0000 mg | Freq: Four times a day (QID) | INTRAMUSCULAR | Status: DC | PRN
  Administered 2019-07-09: 10:00:00 4 mg via INTRAVENOUS
  Filled 2019-07-07: qty 2

## 2019-07-07 MED ORDER — HYDROMORPHONE HCL 1 MG/ML IJ SOLN: 1.0000 mg | INTRAMUSCULAR | Status: DC | PRN

## 2019-07-07 MED ORDER — METOCLOPRAMIDE HCL 5 MG/ML IJ SOLN
INTRAMUSCULAR | Status: AC
  Filled 2019-07-07: qty 2

## 2019-07-07 MED ORDER — FENTANYL CITRATE (PF) 100 MCG/2ML IJ SOLN
INTRAMUSCULAR | Status: DC | PRN
  Administered 2019-07-07: 25 ug via INTRAVENOUS
  Administered 2019-07-07: 100 ug via INTRAVENOUS

## 2019-07-07 MED ORDER — IBUPROFEN 800 MG PO TABS
800.0000 mg | ORAL_TABLET | Freq: Three times a day (TID) | ORAL | Status: DC
  Administered 2019-07-08 – 2019-07-10 (×7): 800 mg via ORAL
  Filled 2019-07-07 (×5): qty 1
  Filled 2019-07-07: qty 2
  Filled 2019-07-07: qty 1

## 2019-07-07 MED ORDER — FENTANYL CITRATE (PF) 100 MCG/2ML IJ SOLN
INTRAMUSCULAR | Status: AC
  Administered 2019-07-07: 09:00:00 100 ug via INTRAVENOUS
  Filled 2019-07-07: qty 2

## 2019-07-07 MED ORDER — KETOROLAC TROMETHAMINE 30 MG/ML IJ SOLN
30.0000 mg | Freq: Four times a day (QID) | INTRAMUSCULAR | Status: AC
  Administered 2019-07-07 – 2019-07-08 (×3): 30 mg via INTRAVENOUS
  Filled 2019-07-07 (×4): qty 1

## 2019-07-07 MED ORDER — DIPHENHYDRAMINE HCL 50 MG/ML IJ SOLN
INTRAMUSCULAR | Status: AC
  Filled 2019-07-07: qty 1

## 2019-07-07 SURGICAL SUPPLY — 50 items
BARRIER ADHS 3X4 INTERCEED (GAUZE/BANDAGES/DRESSINGS) IMPLANT
BENZOIN TINCTURE PRP APPL 2/3 (GAUZE/BANDAGES/DRESSINGS) ×3 IMPLANT
CANISTER SUCT 3000ML PPV (MISCELLANEOUS) ×3 IMPLANT
CLOSURE WOUND 1/2 X4 (GAUZE/BANDAGES/DRESSINGS) ×1
CONT SPEC 4OZ CLIKSEAL STRL BL (MISCELLANEOUS) ×3 IMPLANT
COVER WAND RF STERILE (DRAPES) ×3 IMPLANT
DECANTER SPIKE VIAL GLASS SM (MISCELLANEOUS) IMPLANT
DRAPE WARM FLUID 44X44 (DRAPES) ×3 IMPLANT
DRSG OPSITE POSTOP 4X10 (GAUZE/BANDAGES/DRESSINGS) ×3 IMPLANT
DURAPREP 26ML APPLICATOR (WOUND CARE) ×3 IMPLANT
GAUZE 4X4 16PLY RFD (DISPOSABLE) IMPLANT
GLOVE BIO SURGEON STRL SZ7.5 (GLOVE) ×3 IMPLANT
GLOVE BIOGEL PI IND STRL 7.0 (GLOVE) ×2 IMPLANT
GLOVE BIOGEL PI INDICATOR 7.0 (GLOVE) ×4
GOWN STRL REUS W/ TWL LRG LVL3 (GOWN DISPOSABLE) ×2 IMPLANT
GOWN STRL REUS W/ TWL XL LVL3 (GOWN DISPOSABLE) ×1 IMPLANT
GOWN STRL REUS W/TWL LRG LVL3 (GOWN DISPOSABLE) ×6
GOWN STRL REUS W/TWL XL LVL3 (GOWN DISPOSABLE) ×3
HIBICLENS CHG 4% 4OZ BTL (MISCELLANEOUS) ×3 IMPLANT
KIT TURNOVER KIT B (KITS) ×3 IMPLANT
NEEDLE HYPO 22GX1.5 SAFETY (NEEDLE) ×3 IMPLANT
NS IRRIG 1000ML POUR BTL (IV SOLUTION) ×3 IMPLANT
PACK ABDOMINAL GYN (CUSTOM PROCEDURE TRAY) ×3 IMPLANT
PAD ARMBOARD 7.5X6 YLW CONV (MISCELLANEOUS) ×3 IMPLANT
PAD OB MATERNITY 4.3X12.25 (PERSONAL CARE ITEMS) ×3 IMPLANT
RETRACTOR WND ALEXIS 25 LRG (MISCELLANEOUS) ×1 IMPLANT
RTRCTR WOUND ALEXIS 25CM LRG (MISCELLANEOUS) ×3
SPECIMEN JAR MEDIUM (MISCELLANEOUS) ×3 IMPLANT
SPONGE LAP 18X18 RF (DISPOSABLE) ×6 IMPLANT
STRIP CLOSURE SKIN 1/2X4 (GAUZE/BANDAGES/DRESSINGS) ×2 IMPLANT
SUT VIC AB 0 CT1 18XCR BRD8 (SUTURE) ×3 IMPLANT
SUT VIC AB 0 CT1 27 (SUTURE) ×12
SUT VIC AB 0 CT1 27XBRD ANBCTR (SUTURE) ×4 IMPLANT
SUT VIC AB 0 CT1 8-18 (SUTURE) ×9
SUT VIC AB 1 CT1 18XBRD ANBCTR (SUTURE) ×2 IMPLANT
SUT VIC AB 1 CT1 36 (SUTURE) ×3 IMPLANT
SUT VIC AB 1 CT1 8-18 (SUTURE) ×6
SUT VIC AB 1 CTX 36 (SUTURE) ×9
SUT VIC AB 1 CTX36XBRD ANBCTRL (SUTURE) ×3 IMPLANT
SUT VIC AB 2-0 CT1 27 (SUTURE) ×9
SUT VIC AB 2-0 CT1 TAPERPNT 27 (SUTURE) ×3 IMPLANT
SUT VIC AB 3-0 CT1 27 (SUTURE)
SUT VIC AB 3-0 CT1 TAPERPNT 27 (SUTURE) IMPLANT
SUT VIC AB 3-0 SH 27 (SUTURE)
SUT VIC AB 3-0 SH 27X BRD (SUTURE) IMPLANT
SUT VIC AB 4-0 KS 27 (SUTURE) ×3 IMPLANT
SUT VICRYL 1 TIES 12X18 (SUTURE) ×3 IMPLANT
SYR CONTROL 10ML LL (SYRINGE) ×3 IMPLANT
TOWEL GREEN STERILE FF (TOWEL DISPOSABLE) ×6 IMPLANT
TRAY FOLEY W/BAG SLVR 14FR (SET/KITS/TRAYS/PACK) ×3 IMPLANT

## 2019-07-07 NOTE — Anesthesia Procedure Notes (Signed)
Anesthesia [facility] Block: TAP block   Pre-Anesthetic Checklist: ,, timeout performed, Correct Patient, Correct Site, Correct Laterality, Correct Procedure, Correct Position, site marked, Risks and benefits discussed,  Surgical consent,  Pre-op evaluation,  At surgeon's request and post-op pain management  Laterality: Right  Prep: chloraprep       Needles:   Needle Type: Echogenic Needle     Needle Length: 9cm  Needle Gauge: 21     Additional Needles:   Procedures: Doppler guided,,,, ultrasound used (permanent image in chart),,,,  Narrative:  Start time: 07/07/2019 9:10 AM End time: 07/07/2019 9:23 AM  Performed by: Personally  Anesthesiologist: Barnet Glasgow, MD

## 2019-07-07 NOTE — Op Note (Signed)
Caleigh Imel PROCEDURE DATE: 07/07/2019  PREOPERATIVE DIAGNOSIS:  Symptomatic fibroids, menorrhagia POSTOPERATIVE DIAGNOSIS:  Symptomatic fibroids, menorrhagia SURGEON:   Arlina Robes M.D. ASSISTANT: Aletha Halim, M.D. OPERATION:  Total abdominal hysterectomy, Bilateral Salpingectomy ANESTHESIA:  General endotracheal plus TAP block  INDICATIONS: The patient is a 44 y.o. G1P0010 with the aforementioned diagnoses who desires definitive surgical management. On the preoperative visit, the risks, benefits, indications, and alternatives of the procedure were reviewed with the patient.  On the day of surgery, the risks of surgery were again discussed with the patient including but not limited to: bleeding which may require transfusion or reoperation; infection which may require antibiotics; injury to bowel, bladder, ureters or other surrounding organs; need for additional procedures; thromboembolic phenomenon, incisional problems and other postoperative/anesthesia complications. Written informed consent was obtained.    OPERATIVE FINDINGS: A 20 week size uterus with normal tubes and ovaries bilaterally. Clear vesicular lesion on portion of omentum.   ESTIMATED BLOOD LOSS: 50 ml FLUIDS:  As recorded URINE OUTPUT:  As recorded SPECIMENS:  Uterus,cervix,  bilateral fallopian tubes  sent to pathology. Portion of omentum for frozen, benign  COMPLICATIONS:  None immediate.   DESCRIPTION OF PROCEDURE:  The patient received intravenous antibiotics and had sequential compression devices applied to her lower extremities while in the preoperative area.   She was taken to the operating room and placed under general anesthesia and TAP block without difficulty.The abdomen and perineum were prepped and draped in a sterile manner, and she was placed in a dorsal supine position.  A Foley catheter was inserted into the bladder and attached to constant drainage. After an adequate timeout was performed, a Pfannensteil  skin incision was made. This incision was taken down to the fascia using electrocautery with care given to maintain good hemostasis. The fascia was incised in the midline and the fascial incision was then extended bilaterally using electrocautery without difficulty. The fascia was then dissected off the underlying rectus muscles using blunt and sharp dissection. The rectus muscles were split bluntly in the midline and the peritoneum entered sharply without complication. This peritoneal incision was then extended superiorly and inferiorly with care given to prevent bowel or bladder injury. Attention was then turned to the pelvis. An Alexis retractor was placed and the bowel was packed away with moist laparotomy sponges. The above findings were noted. The omentum in question was cross clamped and removed after securing base with a free tie. Omentum portion was sent to pathology, results as noted above.  The uterus was delivered up out of the abdomen.  The round ligaments on each side were clamped, suture ligated with 0 Vicryl, and transected with electrocautery allowing entry into the broad ligament. Of note, all sutures used in this procedure are 0 Vicryl unless otherwise noted. The anterior and posterior leaves of the broad ligament were separated, and the ureters were inspected to be safely away from the area of dissection bilaterally.  Adnexae were clamped on the patient's right side, cut, and doubly suture ligated. This procedure was repeated in an identical fashion on the left site allowing for both adnexa to remain in place.  Kelly clamps were placed on the mesosalpinx of the right fallopian tube, and the fallopian tube was excised.  The pedicle was then secured with a free tie.  A similar process was carried out on the left side, allowing for bilateral salpingectomy.      A bladder flap was then created.  The bladder was then bluntly dissected off the  lower uterine segment and cervix with good hemostasis  noted. The uterine arteries were then skeletonized bilaterally and then clamped, cut, and doubly suture ligated with care given to prevent ureteral injury. The cardinal ligaments were clamped on each side, cut and ligated. This was carried down to the cervix.  Curved clamps were placed across the vagina just under the cervix, and the specimen was amputated and sent to pathology. The vaginal cuff angles were closed  on both sides. The middle of the vaginal cuff was closed with a series of interrupted figure-of-eight sutures.  The pelvis was irrigated and hemostasis was reconfirmed at all pedicles and along the pelvic sidewall.  The ureters were inspected and noted to be peristalsing bilaterally.  All laparotomy sponges and instruments were removed from the abdomen. The peritoneum and abdominal muscles were closed with a 2/0 Vicryl. The fascia was also closed in a running fashion with 0 Vicryl. The subcutaneous layer was reapproximated with 2-0 Vicryl. The skin was closed with a 4-0 Vicryl subcuticular stitch. Sponge, lap, needle, and instrument counts were correct times two. The patient was taken to the recovery area awake, extubated and in stable condition.  An experienced assistant was required given the standard of surgical care given the complexity of the case.  This assistant was needed for exposure, dissection, suctioning, retraction and for overall help during the procedure.    Arlina Robes, MD, [place] Attending [place], [facility]

## 2019-07-07 NOTE — Plan of Care (Signed)

## 2019-07-07 NOTE — Transfer of Care (Signed)
Immediate Anesthesia Transfer of Care Note  Patient: Rebecca Payne  Procedure(s) Performed: HYSTERECTOMY ABDOMINAL WITH BILATERAL SALPINGECTOMY AND FROZEN BIOPSY OF OMENTAL LESION (Bilateral Abdomen)  Patient Location: PACU  Anesthesia Type:General and Regional  Level of Consciousness: drowsy and patient cooperative  Airway & Oxygen Therapy: Patient Spontanous Breathing and Patient connected to face mask oxygen  Post-op Assessment: Report given to RN and Post -op Vital signs reviewed and stable  Post vital signs: Reviewed and stable  Last Vitals:  Vitals Value Taken Time  BP    Temp    Pulse 67 07/07/19 1140  Resp 15 07/07/19 1140  SpO2 100 % 07/07/19 1140  Vitals shown include unvalidated device data.  Last Pain:  Vitals:   07/07/19 0935  TempSrc:   PainSc: 0-No pain      Patients Stated Pain Goal: 5 ([id number])  Complications: No apparent anesthesia complications

## 2019-07-07 NOTE — Interval H&P Note (Signed)
History and Physical Interval Note:  07/07/2019 9:09 AM  Rebecca Payne  has presented today for surgery, with the diagnosis of Fibroids.  The various methods of treatment have been discussed with the patient and family. After consideration of risks, benefits and other options for treatment, the patient has consented to  Procedure(s) with comments: HYSTERECTOMY ABDOMINAL WITH SALPINGECTOMY (Bilateral) - TAP BLOCK REQUESTED as a surgical intervention.  The patient's history has been reviewed, patient examined, no change in status, stable for surgery.  I have reviewed the patient's chart and labs.  Questions were answered to the patient's satisfaction.     [name]

## 2019-07-07 NOTE — Anesthesia Procedure Notes (Signed)
Anesthesia Regional Block: TAP block   Pre-Anesthetic Checklist: ,, timeout performed, Correct Patient, Correct Site, Correct Laterality, Correct Procedure, Correct Position, site marked, Risks and benefits discussed,  Surgical consent,  Pre-op evaluation,  At surgeon's request and post-op pain management  Laterality: Left  Prep: chloraprep       Needles:  Injection technique: Single-shot  Needle Type: Echogenic Needle     Needle Length: 9cm  Needle Gauge: 20     Additional Needles:   Procedures: Doppler guided,,,, ultrasound used (permanent image in chart),,,,  Narrative:  Start time: 07/07/2019 9:00 AM End time: 07/07/2019 9:09 AM  Performed by: Personally  Anesthesiologist: Barnet Glasgow, MD

## 2019-07-07 NOTE — Anesthesia Procedure Notes (Signed)
Procedure Name: Intubation Date/Time: 07/07/2019 10:00 AM Performed by: Kathryne Hitch, CRNA Pre-anesthesia Checklist: Emergency Drugs available, Patient identified, Suction available and Patient being monitored Patient Re-evaluated:Patient Re-evaluated prior to induction Oxygen Delivery Method: Circle system utilized Preoxygenation: Pre-oxygenation with 100% oxygen Induction Type: IV induction Ventilation: Mask ventilation without difficulty Laryngoscope Size: Miller and 2 Grade View: Grade I Tube type: Oral Tube size: 7.0 mm Number of attempts: 1 Airway Equipment and Method: Stylet and Oral airway Placement Confirmation: ETT inserted through vocal cords under direct vision,  positive ETCO2 and breath sounds checked- equal and bilateral Secured at: 21 cm Tube secured with: Tape Dental Injury: Teeth and Oropharynx as per pre-operative assessment

## 2019-07-08 ENCOUNTER — Encounter: Payer: Self-pay | Admitting: *Deleted

## 2019-07-08 DIAGNOSIS — Z9889 Other specified postprocedural states: Secondary | ICD-10-CM

## 2019-07-08 DIAGNOSIS — D219 Benign neoplasm of connective and other soft tissue, unspecified: Secondary | ICD-10-CM

## 2019-07-08 LAB — BASIC METABOLIC PANEL
Anion gap: 9 (ref 5–15)
BUN: 10 mg/dL (ref 6–20)
CO2: 23 mmol/L (ref 22–32)
Calcium: 9.1 mg/dL (ref 8.9–10.3)
Chloride: 106 mmol/L (ref 98–111)
Creatinine, Ser: 0.59 mg/dL (ref 0.44–1.00)
GFR calc Af Amer: >60 mL/min (ref >60)
GFR calc non Af Amer: >60 mL/min (ref >60)
Glucose, Bld: 113 mg/dL — ABNORMAL HIGH (ref 70–99)
Potassium: 4.8 mmol/L (ref 3.5–5.1)
Sodium: 138 mmol/L (ref 135–145)

## 2019-07-08 LAB — CBC
HCT: 38.8 % (ref 36.0–46.0)
Hemoglobin: 11.9 g/dL — ABNORMAL LOW (ref 12.0–15.0)
MCH: 22.3 pg — ABNORMAL LOW (ref 26.0–34.0)
MCHC: 30.7 g/dL (ref 30.0–36.0)
MCV: 72.7 fL — ABNORMAL LOW (ref 80.0–100.0)
Platelets: 217 10*3/uL (ref 150–400)
RBC: 5.34 MIL/uL — ABNORMAL HIGH (ref 3.87–5.11)
RDW: 17.4 % — ABNORMAL HIGH (ref 11.5–15.5)
WBC: 9.1 10*3/uL (ref 4.0–10.5)
nRBC: 0 % (ref 0.0–0.2)

## 2019-07-08 MED ORDER — CHLORHEXIDINE GLUCONATE CLOTH 2 % EX PADS: 6.0000 | MEDICATED_PAD | Freq: Every day | CUTANEOUS | Status: DC

## 2019-07-08 NOTE — Progress Notes (Signed)
1 Day Post-Op Procedure(s) (LRB): HYSTERECTOMY ABDOMINAL WITH BILATERAL SALPINGECTOMY AND FROZEN BIOPSY OF OMENTAL LESION (Bilateral)  Subjective: Rebecca Payne is sitting up in the chair. Reports feeling sore otherwise no complaints. Tolerating diet. Pain controlled. Has not voided yet, foley just removed this morning.   Objective: AF VSS Good UOP Lungs clear Heart RRR Abd soft + BS drsg intact Ext non tender  Assessment: s/p Procedure(s) with comments: HYSTERECTOMY ABDOMINAL WITH BILATERAL SALPINGECTOMY AND FROZEN BIOPSY OF OMENTAL LESION (Bilateral) - TAP BLOCK REQUESTED: stable  Plan: Encourage ambulation. Pt may shower. Probable home tomorrow.   LOS: 1 day    [name] 07/08/2019, 10:24 AM

## 2019-07-08 NOTE — Plan of Care (Signed)

## 2019-07-08 NOTE — Anesthesia Postprocedure Evaluation (Signed)
Anesthesia Post Note  Patient: Environmental consultant  Procedure(s) Performed: HYSTERECTOMY ABDOMINAL WITH BILATERAL SALPINGECTOMY AND FROZEN BIOPSY OF OMENTAL LESION (Bilateral Abdomen)     Patient location during evaluation: PACU Anesthesia Type: General Level of consciousness: awake and alert Pain management: pain level controlled Vital Signs Assessment: post-procedure vital signs reviewed and stable Respiratory status: spontaneous breathing, nonlabored ventilation, respiratory function stable and patient connected to nasal cannula oxygen Cardiovascular status: blood pressure returned to baseline and stable Postop Assessment: no apparent nausea or vomiting Anesthetic complications: no    Last Vitals:  Vitals:   07/08/19 0125 07/08/19 0421  BP: 116/68 115/76  Pulse: 78 75  Resp: 18 17  Temp: 37 C 36.9 C  SpO2: 98% 99%    Last Pain:  Vitals:   07/08/19 0421  TempSrc: Oral  PainSc:                  Barnet Glasgow

## 2019-07-09 LAB — SURGICAL PATHOLOGY

## 2019-07-09 NOTE — Progress Notes (Signed)
2 Days Post-Op Procedure(s) (LRB): HYSTERECTOMY ABDOMINAL WITH BILATERAL SALPINGECTOMY AND FROZEN BIOPSY OF OMENTAL LESION (Bilateral)  Subjective: Patient reports not feeling well this morning. Did not sleep well last night. More pain today but pain medication helps. Tolerating diet. + BM  Voiding without problems. Ambulated x 1 in hallway yesterday  Objective: AF VSS  Lungs clear Heart RRR Abd soft + BS appropriate tenderness along incision, drsg intact Ext non tender  Assessment: s/p Procedure(s) with comments: HYSTERECTOMY ABDOMINAL WITH BILATERAL SALPINGECTOMY AND FROZEN BIOPSY OF OMENTAL LESION (Bilateral) - TAP BLOCK REQUESTED: stable  Plan: Encourage pain medication as needed. Shower and ambulate  LOS: 2 days    Rebecca Payne 07/09/2019, 9:37 AM

## 2019-07-09 NOTE — Plan of Care (Signed)
  Problem: Activity: Goal: Risk for activity intolerance will decrease Outcome: Adequate for Discharge   Problem: Nutrition: Goal: Adequate nutrition will be maintained Outcome: Adequate for Discharge   Problem: Elimination: Goal: Will not experience complications related to bowel motility Outcome: Progressing   Problem: Pain Managment: Goal: General experience of comfort will improve Outcome: Progressing

## 2019-07-10 MED ORDER — IBUPROFEN 800 MG PO TABS: 800.0000 mg | ORAL_TABLET | Freq: Three times a day (TID) | ORAL | 1 refills | Status: DC

## 2019-07-10 MED ORDER — HYDROMORPHONE HCL 2 MG PO TABS: 2.0000 mg | ORAL_TABLET | ORAL | 0 refills | Status: DC | PRN

## 2019-07-10 MED ORDER — SENNA 8.6 MG PO TABS: 1.0000 | ORAL_TABLET | Freq: Two times a day (BID) | ORAL | 0 refills | Status: DC

## 2019-07-10 NOTE — Discharge Summary (Signed)
Physician Discharge Summary  Patient ID: Rebecca Payne MRN: [id number] DOB/AGE: 1975/06/20 44 y.o.  Admit date: 07/07/2019 Discharge date: 07/10/2019  Admission Diagnoses: Uterine Fibroids  Discharge Diagnoses:  Active Problems:   Fibroids   Post-operative state   Discharged Condition: good  Hospital Course: Pt underwent TAH/BS without problems. See OP note for additional information. Pt progressed to ambulating, voiding, tolerating diet, voiding, + flatus and good oral pain control. Felt amenable for discharge home on POD # 3. Discharge instructions, medications and follow up reviewed with pt. Pt verbalized understanding  Consults: None  Significant Diagnostic Studies: labs  Treatments: surgery: TAH/BS  Discharge Exam: Blood pressure 132/84, pulse 90, temperature 97.9 F (36.6 C), temperature source Oral, resp. rate 18, height 5\' 4"  (1.626 m), weight 107.2 kg, SpO2 100 %.  Lungs clear Heart RRR Abd soft + BS incision CDI GU no bleeding Ext non tender   Disposition: Discharge disposition: 01-Home or Self Care       Discharge Instructions    Call MD for:  difficulty breathing, headache or visual disturbances   Complete by: As directed    Call MD for:  extreme fatigue   Complete by: As directed    Call MD for:  hives   Complete by: As directed    Call MD for:  persistant dizziness or light-headedness   Complete by: As directed    Call MD for:  persistant nausea and vomiting   Complete by: As directed    Call MD for:  redness, tenderness, or signs of infection (pain, swelling, redness, odor or green/yellow discharge around incision site)   Complete by: As directed    Call MD for:  severe uncontrolled pain   Complete by: As directed    Call MD for:  temperature >100.4   Complete by: As directed    Diet - low sodium heart healthy   Complete by: As directed    Increase activity slowly   Complete by: As directed      Allergies as of 07/10/2019      Reactions    Influenza Vaccines Swelling   Made gums and face swell   Codeine Swelling, Rash   SWELLING REACTION UNSPECIFIED    Lasix [furosemide] Nausea Only, Other (See Comments)   "took with potassium and had an episode with her vision " EPISODE REACTION NOT SPECIFIED      Medication List    STOP taking these medications   ferrous sulfate 325 (65 FE) MG tablet   vitamin B-12 250 MCG tablet Commonly known as: CYANOCOBALAMIN     TAKE these medications   folic acid 1 MG tablet Commonly known as: FOLVITE Take 1 tablet (1 mg total) by mouth daily.   HYDROmorphone 2 MG tablet Commonly known as: DILAUDID Take 1 tablet (2 mg total) by mouth every 4 (four) hours as needed for severe pain ((when tolerating fluids)).   ibuprofen 800 MG tablet Commonly known as: ADVIL Take 1 tablet (800 mg total) by mouth 3 (three) times daily.   multivitamin with minerals Tabs tablet Take 1 tablet by mouth daily.   senna 8.6 MG Tabs tablet Commonly known as: SENOKOT Take 1 tablet (8.6 mg total) by mouth 2 (two) times daily.   Vitamin D3 50 MCG (2000 UT) Tabs Take 2,000 Units by mouth daily.      Follow-up [place] for [facility] Follow up in 4 week(s).   Specialty: Obstetrics and Gynecology Why: Post op appt in 4  weeks with Dr. Gardiner Fanti Contact information: [address] [place], [id number] [place] [phone]          Signed: [name] 07/10/2019, 12:32 PM

## 2019-07-10 NOTE — Discharge Instructions (Signed)
Abdominal Hysterectomy, Care After This sheet gives you information about how to care for yourself after your procedure. Your doctor may also give you more specific instructions. If you have problems or questions, contact your doctor. Follow these instructions at home: Bathing  Do not take baths, swim, or use a hot tub until your doctor says it is okay. Ask your doctor if you can take showers. You may only be allowed to take sponge baths for bathing.  Keep the bandage (dressing) dry until your doctor says it can be taken off. Surgical cut (incision) care      Follow instructions from your doctor about how to take care of your cut from surgery. Make sure you: ? Wash your hands with soap and water before you change your bandage (dressing). If you cannot use soap and water, use hand sanitizer. ? Change your bandage as told by your doctor. ? Leave stitches (sutures), skin glue, or skin tape (adhesive) strips in place. They may need to stay in place for 2 weeks or longer. If tape strips get loose and curl up, you may trim the loose edges. Do not remove tape strips completely unless your doctor says it is okay.  Check your surgical cut area every day for signs of infection. Check for: ? Redness, swelling, or pain. ? Fluid or blood. ? Warmth. ? Pus or a bad smell. Activity  Do gentle, daily exercise as told by your doctor. You may be told to take short walks every day and go farther each time.  Do not lift anything that is heavier than 10 lb (4.5 kg), or the limit that your doctor tells you, until he or she says that it is safe.  Do not drive or use heavy machinery while taking prescription pain medicine.  Do not drive for 24 hours if you were given a medicine to help you relax (sedative).  Follow your doctor's advice about exercise, driving, and general activities. Ask your doctor what activities are safe for you. Lifestyle   Do not douche, use tampons, or have sex for at least 6 weeks  or as told by your doctor.  Do not drink alcohol until your doctor says it is okay.  Drink enough fluid to keep your pee (urine) clear or pale yellow.  Try to have someone at home with you for the first 1-2 weeks to help.  Do not use any products that contain nicotine or tobacco, such as cigarettes and e-cigarettes. These can slow down healing. If you need help quitting, ask your doctor. General instructions  Take over-the-counter and prescription medicines only as told by your doctor.  Do not take aspirin or ibuprofen. These medicines can cause bleeding.  To prevent or treat constipation while you are taking prescription pain medicine, your doctor may suggest that you: ? Drink enough fluid to keep your urine clear or pale yellow. ? Take over-the-counter or prescription medicines. ? Eat foods that are high in fiber, such as:  Fresh fruits and vegetables.  Whole grains.  Beans. ? Limit foods that are high in fat and processed sugars, such as fried and sweet foods.  Keep all follow-up visits as told by your doctor. This is important. Contact a doctor if:  You have chills or fever.  You have redness, swelling, or pain around your cut.  You have fluid or blood coming from your cut.  Your cut feels warm to the touch.  You have pus or a bad smell coming from your cut.    Your cut breaks open.  You feel dizzy or light-headed.  You have pain or bleeding when you pee.  You keep having watery poop (diarrhea).  You keep feeling sick to your stomach (nauseous) or keep throwing up (vomiting).  You have unusual fluid (discharge) coming from your vagina.  You have a rash.  You have a reaction to your medicine.  Your pain medicine does not help. Get help right away if:  You have a fever and your symptoms get worse all of a sudden.  You have very bad belly (abdominal) pain.  You are short of breath.  You pass out (faint).  You have pain, swelling, or redness of your  leg.  You bleed a lot from your vagina and notice clumps of blood (clots). Summary  Do not take baths, swim, or use a hot tub until your doctor says it is okay. Ask your doctor if you can take showers. You may only be allowed to take sponge baths for bathing.  Follow your doctor's advice about exercise, driving, and general activities. Ask your doctor what activities are safe for you.  Do not lift anything that is heavier than 10 lb (4.5 kg), or the limit that your doctor tells you, until he or she says that it is safe.  Try to have someone at home with you for the first 1-2 weeks to help. This information is not intended to replace advice given to you by your health care provider. Make sure you discuss any questions you have with your health care provider. Document Revised: 06/19/2018 Document Reviewed: 04/04/2016 Elsevier Patient Education  2020 [employer]  

## 2019-07-10 NOTE — Progress Notes (Signed)
Patient discharged to home with instructions. 

## 2019-07-13 LAB — BPAM RBC
Blood Product Expiration Date: 202103132359
Blood Product Expiration Date: 202103162359
ISSUE DATE / TIME: 202103131717
Unit Type and Rh: 7300
Unit Type and Rh: 7300

## 2019-07-13 LAB — TYPE AND SCREEN
ABO/RH(D): B POS
Antibody Screen: POSITIVE
Donor AG Type: NEGATIVE
Donor AG Type: NEGATIVE
Unit division: 0
Unit division: 0

## 2019-07-21 ENCOUNTER — Encounter: Payer: Self-pay | Admitting: Obstetrics and Gynecology

## 2019-07-21 ENCOUNTER — Other Ambulatory Visit: Payer: Self-pay

## 2019-07-21 ENCOUNTER — Ambulatory Visit (INDEPENDENT_AMBULATORY_CARE_PROVIDER_SITE_OTHER): Payer: BLUE CROSS/BLUE SHIELD | Admitting: Obstetrics and Gynecology

## 2019-07-21 VITALS — BP 125/91 | HR 91 | Ht 64.0 in | Wt 231.0 lb

## 2019-07-21 DIAGNOSIS — Z48816 Encounter for surgical aftercare following surgery on the genitourinary system: Secondary | ICD-10-CM

## 2019-07-21 DIAGNOSIS — Z9889 Other specified postprocedural states: Secondary | ICD-10-CM

## 2019-07-21 NOTE — Progress Notes (Signed)
Rebecca Payne is here for post op TAH/BS on 07/06/2019 Discharged home on POD # 3. She has no complaints today except feels sore. Denies bowel or bladder dysfunction. Tolerating regular diet Pathology reviewed with pt.  PE AF VSS Lungs clear Heart RRR Abd soft + BS incision well healed  A/P Post op TAH/BS Doing well Continue to increase ADL's as tolerates Return to work note provided F/U in 1 yr or PRN

## 2019-07-21 NOTE — Patient Instructions (Signed)
Health Maintenance, Female Adopting a healthy lifestyle and getting preventive care are important in promoting health and wellness. Ask your health care provider about:  The right schedule for you to have regular tests and exams.  Things you can do on your own to prevent diseases and keep yourself healthy. What should I know about diet, weight, and exercise? Eat a healthy diet   Eat a diet that includes plenty of vegetables, fruits, low-fat dairy products, and lean protein.  Do not eat a lot of foods that are high in solid fats, added sugars, or sodium. Maintain a healthy weight Body mass index (BMI) is used to identify weight problems. It estimates body fat based on height and weight. Your health care provider can help determine your BMI and help you achieve or maintain a healthy weight. Get regular exercise Get regular exercise. This is one of the most important things you can do for your health. Most adults should:  Exercise for at least 150 minutes each week. The exercise should increase your heart rate and make you sweat (moderate-intensity exercise).  Do strengthening exercises at least twice a week. This is in addition to the moderate-intensity exercise.  Spend less time sitting. Even light physical activity can be beneficial. Watch cholesterol and blood lipids Have your blood tested for lipids and cholesterol at 43 years of age, then have this test every 5 years. Have your cholesterol levels checked more often if:  Your lipid or cholesterol levels are high.  You are older than 43 years of age.  You are at high risk for heart disease. What should I know about cancer screening? Depending on your health history and family history, you may need to have cancer screening at various ages. This may include screening for:  Breast cancer.  Cervical cancer.  Colorectal cancer.  Skin cancer.  Lung cancer. What should I know about heart disease, diabetes, and high blood  pressure? Blood pressure and heart disease  High blood pressure causes heart disease and increases the risk of stroke. This is more likely to develop in people who have high blood pressure readings, are of African descent, or are overweight.  Have your blood pressure checked: ? Every 3-5 years if you are 18-39 years of age. ? Every year if you are 40 years old or older. Diabetes Have regular diabetes screenings. This checks your fasting blood sugar level. Have the screening done:  Once every three years after age 40 if you are at a normal weight and have a low risk for diabetes.  More often and at a younger age if you are overweight or have a high risk for diabetes. What should I know about preventing infection? Hepatitis B If you have a higher risk for hepatitis B, you should be screened for this virus. Talk with your health care provider to find out if you are at risk for hepatitis B infection. Hepatitis C Testing is recommended for:  Everyone born from 1945 through 1965.  Anyone with known risk factors for hepatitis C. Sexually transmitted infections (STIs)  Get screened for STIs, including gonorrhea and chlamydia, if: ? You are sexually active and are younger than 43 years of age. ? You are older than 43 years of age and your health care provider tells you that you are at risk for this type of infection. ? Your sexual activity has changed since you were last screened, and you are at increased risk for chlamydia or gonorrhea. Ask your health care provider if   you are at risk.  Ask your health care provider about whether you are at high risk for HIV. Your health care provider may recommend a prescription medicine to help prevent HIV infection. If you choose to take medicine to prevent HIV, you should first get tested for HIV. You should then be tested every 3 months for as long as you are taking the medicine. Pregnancy  If you are about to stop having your period (premenopausal) and  you may become pregnant, seek counseling before you get pregnant.  Take 400 to 800 micrograms (mcg) of folic acid every day if you become pregnant.  Ask for birth control (contraception) if you want to prevent pregnancy. Osteoporosis and menopause Osteoporosis is a disease in which the bones lose minerals and strength with aging. This can result in bone fractures. If you are 65 years old or older, or if you are at risk for osteoporosis and fractures, ask your health care provider if you should:  Be screened for bone loss.  Take a calcium or vitamin D supplement to lower your risk of fractures.  Be given hormone replacement therapy (HRT) to treat symptoms of menopause. Follow these instructions at home: Lifestyle  Do not use any products that contain nicotine or tobacco, such as cigarettes, e-cigarettes, and chewing tobacco. If you need help quitting, ask your health care provider.  Do not use street drugs.  Do not share needles.  Ask your health care provider for help if you need support or information about quitting drugs. Alcohol use  Do not drink alcohol if: ? Your health care provider tells you not to drink. ? You are pregnant, may be pregnant, or are planning to become pregnant.  If you drink alcohol: ? Limit how much you use to 0-1 drink a day. ? Limit intake if you are breastfeeding.  Be aware of how much alcohol is in your drink. In the [place] one drink equals one 12 oz bottle of beer (355 mL), one 5 oz glass of wine (148 mL), or one 1 oz glass of hard liquor (44 mL). General instructions  Schedule regular health, dental, and eye exams.  Stay current with your vaccines.  Tell your health care provider if: ? You often feel depressed. ? You have ever been abused or do not feel safe at home. Summary  Adopting a healthy lifestyle and getting preventive care are important in promoting health and wellness.  Follow your health care provider's instructions about healthy  diet, exercising, and getting tested or screened for diseases.  Follow your health care provider's instructions on monitoring your cholesterol and blood pressure. This information is not intended to replace advice given to you by your health care provider. Make sure you discuss any questions you have with your health care provider. Document Revised: 04/09/2018 Document Reviewed: 04/09/2018 Elsevier Patient Education  2020 [employer]  

## 2019-07-23 ENCOUNTER — Encounter: Payer: Self-pay | Admitting: Obstetrics and Gynecology

## 2019-11-14 ENCOUNTER — Emergency Department (HOSPITAL_COMMUNITY)
Admission: EM | Admit: 2019-11-14 | Discharge: 2019-11-14 | Disposition: A | Discharge status: Home / Self Care | Payer: BLUE CROSS/BLUE SHIELD | Attending: Emergency Medicine | Admitting: Emergency Medicine

## 2019-11-14 ENCOUNTER — Other Ambulatory Visit: Payer: Self-pay

## 2019-11-14 ENCOUNTER — Encounter (HOSPITAL_COMMUNITY): Payer: Self-pay | Admitting: Emergency Medicine

## 2019-11-14 DIAGNOSIS — J45909 Unspecified asthma, uncomplicated: Secondary | ICD-10-CM | POA: Diagnosis not present

## 2019-11-14 DIAGNOSIS — R112 Nausea with vomiting, unspecified: Secondary | ICD-10-CM | POA: Diagnosis present

## 2019-11-14 DIAGNOSIS — R519 Headache, unspecified: Secondary | ICD-10-CM

## 2019-11-14 LAB — COMPREHENSIVE METABOLIC PANEL
ALT: 19 U/L (ref 0–44)
AST: 22 U/L (ref 15–41)
Albumin: 3.4 g/dL — ABNORMAL LOW (ref 3.5–5.0)
Alkaline Phosphatase: 93 U/L (ref 38–126)
Anion gap: 10 (ref 5–15)
BUN: 6 mg/dL (ref 6–20)
CO2: 22 mmol/L (ref 22–32)
Calcium: 8.8 mg/dL — ABNORMAL LOW (ref 8.9–10.3)
Chloride: 105 mmol/L (ref 98–111)
Creatinine, Ser: 0.51 mg/dL (ref 0.44–1.00)
GFR calc Af Amer: >60 mL/min (ref >60)
GFR calc non Af Amer: >60 mL/min (ref >60)
Glucose, Bld: 116 mg/dL — ABNORMAL HIGH (ref 70–99)
Potassium: 3.2 mmol/L — ABNORMAL LOW (ref 3.5–5.1)
Sodium: 137 mmol/L (ref 135–145)
Total Bilirubin: 0.3 mg/dL (ref 0.3–1.2)
Total Protein: 7 g/dL (ref 6.5–8.1)

## 2019-11-14 LAB — CBC
HCT: 43.9 % (ref 36.0–46.0)
Hemoglobin: 13.4 g/dL (ref 12.0–15.0)
MCH: 22.4 pg — ABNORMAL LOW (ref 26.0–34.0)
MCHC: 30.5 g/dL (ref 30.0–36.0)
MCV: 73.4 fL — ABNORMAL LOW (ref 80.0–100.0)
Platelets: 278 10*3/uL (ref 150–400)
RBC: 5.98 MIL/uL — ABNORMAL HIGH (ref 3.87–5.11)
RDW: 14.2 % (ref 11.5–15.5)
WBC: 6.5 10*3/uL (ref 4.0–10.5)
nRBC: 0 % (ref 0.0–0.2)

## 2019-11-14 LAB — URINALYSIS, ROUTINE W REFLEX MICROSCOPIC
Bacteria, UA: NONE SEEN
Bilirubin Urine: NEGATIVE
Glucose, UA: NEGATIVE mg/dL
Hgb urine dipstick: NEGATIVE
Ketones, ur: NEGATIVE mg/dL
Nitrite: NEGATIVE
Protein, ur: NEGATIVE mg/dL
Specific Gravity, Urine: 1.017 (ref 1.005–1.030)
pH: 6 (ref 5.0–8.0)

## 2019-11-14 LAB — LIPASE, BLOOD: Lipase: 29 U/L (ref 11–51)

## 2019-11-14 MED ORDER — SODIUM CHLORIDE 0.9% FLUSH: 3.0000 mL | Freq: Once | INTRAVENOUS | Status: DC

## 2019-11-14 MED ORDER — DIPHENHYDRAMINE HCL 50 MG/ML IJ SOLN
25.0000 mg | Freq: Once | INTRAMUSCULAR | Status: AC
  Administered 2019-11-14: 25 mg via INTRAVENOUS
  Filled 2019-11-14: qty 1

## 2019-11-14 MED ORDER — ONDANSETRON HCL 4 MG PO TABS: 4.0000 mg | ORAL_TABLET | Freq: Four times a day (QID) | ORAL | 0 refills | Status: DC

## 2019-11-14 MED ORDER — SODIUM CHLORIDE 0.9 % IV BOLUS
1000.0000 mL | Freq: Once | INTRAVENOUS | Status: AC
  Administered 2019-11-14: 1000 mL via INTRAVENOUS

## 2019-11-14 MED ORDER — ONDANSETRON HCL 4 MG/2ML IJ SOLN
4.0000 mg | Freq: Once | INTRAMUSCULAR | Status: AC
  Administered 2019-11-14: 4 mg via INTRAVENOUS
  Filled 2019-11-14: qty 2

## 2019-11-14 MED ORDER — PROCHLORPERAZINE EDISYLATE 10 MG/2ML IJ SOLN
10.0000 mg | Freq: Once | INTRAMUSCULAR | Status: AC
  Administered 2019-11-14: 10 mg via INTRAVENOUS
  Filled 2019-11-14: qty 2

## 2019-11-14 MED ORDER — ACETAMINOPHEN 325 MG PO TABS: 650.0000 mg | ORAL_TABLET | Freq: Once | ORAL | Status: DC

## 2019-11-14 NOTE — ED Provider Notes (Signed)
[facility] EMERGENCY DEPARTMENT Provider Note   CSN: [id number] Arrival date & time: 11/14/19  1421     History Chief Complaint  Patient presents with  . Emesis  . Nausea  . Headache    Rebecca Payne is a 44 y.o. female.  HPI 44 year old female with a history of fibroids, anemia presents to the ER for 4 days of nausea and vomiting.  She states that she was on her way to work on 7/13, when she vomited nonbloody nonbilious stomach content.  Since then she has had multiple episodes of vomiting yesterday.  She also endorses a frontal headache.  No vision changes, numbness, tingling, dizziness, syncope, recent falls, not on blood thinners.  She does not have any abdominal pain, no chest pain or shortness of breath.  No back pain.  No dysuria, diarrhea, or constipation.  She denies any fevers or chills.  She is unaware of any ingestion of food that would cause her symptoms.  Denies any alcohol or drug use, denies use of cannabis.  No vaginal bleeding, discharge.  She states that she has had poor p.o. intake over the last few days, when she tries to eat she vomits.  She has been able to hold down some fluids however.    Past Medical History:  Diagnosis Date  . Anemia   . Asthma   . Blood transfusion without reported diagnosis   . Fibroids 02/23/2019  . Menorrhagia 06/11/2013    Patient Active Problem List   Diagnosis Date Noted  . Post-operative state 07/07/2019  . Symptomatic anemia 01/22/2019  . Vitamin B12 deficiency anemia 06/12/2013  . Iron deficiency anemia due to chronic blood loss 06/11/2013    Past Surgical History:  Procedure Laterality Date  . ABDOMINAL HYSTERECTOMY  07/07/2019  . HYSTERECTOMY ABDOMINAL WITH SALPINGECTOMY Bilateral 07/07/2019   Procedure: HYSTERECTOMY ABDOMINAL WITH BILATERAL SALPINGECTOMY AND FROZEN BIOPSY OF OMENTAL LESION;  Surgeon: [name], MD;  Location: [facility];  Service: Gynecology;  Laterality: Bilateral;  TAP BLOCK  REQUESTED     OB History    Gravida  1   Para      Term      Preterm      AB  1   Living  0     SAB  1   TAB      Ectopic      Multiple      Live Births              No family history on file.  Social History   Tobacco Use  . Smoking status: Never Smoker  . Smokeless tobacco: Never Used  Vaping Use  . Vaping Use: Never used  Substance Use Topics  . Alcohol use: No  . Drug use: No    Home Medications Prior to Admission medications   Medication Sig Start Date End Date Taking? Authorizing Provider  ibuprofen (ADVIL) 800 MG tablet Take 1 tablet (800 mg total) by mouth 3 (three) times daily. 07/10/19   [name], MD  ondansetron (ZOFRAN) 4 MG tablet Take 1 tablet (4 mg total) by mouth every 6 (six) hours. 11/14/19   Garald Balding, PA-C    Allergies    Influenza vaccines, Codeine, and Lasix [furosemide]  Review of Systems   Review of Systems  Constitutional: Positive for activity change. Negative for chills and fever.  HENT: Negative for ear pain and sore throat.   Eyes: Negative for pain and visual disturbance.  Respiratory: Negative for cough and shortness of breath.   Cardiovascular: Negative for chest pain and palpitations.  Gastrointestinal: Positive for nausea and vomiting. Negative for abdominal pain, constipation and diarrhea.  Genitourinary: Negative for dysuria, hematuria, pelvic pain and vaginal bleeding.  Musculoskeletal: Negative for arthralgias and back pain.  Skin: Negative for color change and rash.  Neurological: Negative for seizures and syncope.  All other systems reviewed and are negative.   Physical Exam Updated Vital Signs BP (!) 141/89   Pulse 99   Temp 98.8 F (37.1 C) (Oral)   Resp 16   Ht 5\' 4"  (1.626 m)   Wt 103.9 kg   LMP 03/12/2019 (Approximate)   SpO2 100%   BMI 39.31 kg/m   Physical Exam Vitals and nursing note reviewed.  Constitutional:      General: She is not in acute distress.    Appearance:  She is well-developed. She is not ill-appearing, toxic-appearing or diaphoretic.  HENT:     Head: Normocephalic and atraumatic.  Eyes:     Conjunctiva/sclera: Conjunctivae normal.  Cardiovascular:     Rate and Rhythm: Normal rate and regular rhythm.     Heart sounds: Normal heart sounds. No murmur heard.   Pulmonary:     Effort: Pulmonary effort is normal. No respiratory distress.     Breath sounds: Normal breath sounds.  Abdominal:     Palpations: Abdomen is soft.     Tenderness: There is no abdominal tenderness.     Comments: Overall well-appearing, soft nontender abdomen  Musculoskeletal:     Cervical back: Normal range of motion and neck supple.  Skin:    General: Skin is warm and dry.  Neurological:     Mental Status: She is alert.     Cranial Nerves: No cranial nerve deficit or facial asymmetry.     Comments: Mental Status:  Alert, thought content appropriate, able to give a coherent history. Speech fluent without evidence of aphasia. Able to follow 2 step commands without difficulty.  Cranial Nerves:  II: Peripheral visual fields grossly normal, pupils equal, round, reactive to light III,IV, VI: ptosis not present, extra-ocular motions intact bilaterally  V,VII: smile symmetric, facial light touch sensation equal VIII: hearing grossly normal to voice  X: uvula elevates symmetrically  XI: bilateral shoulder shrug symmetric and strong XII: midline tongue extension without fassiculations Motor:  Normal tone. 5/5 strength of BUE and BLE major muscle groups including strong and equal grip strength and dorsiflexion/plantar flexion Sensory: light touch normal in all extremities. Cerebellar: normal finger-to-nose with bilateral upper extremities, Romberg sign absent Gait: not accessed  Psychiatric:        Mood and Affect: Mood normal.        Behavior: Behavior normal.     ED Results / Procedures / Treatments   Labs (all labs ordered are listed, but only abnormal results  are displayed) Labs Reviewed  COMPREHENSIVE METABOLIC PANEL - Abnormal; Notable for the following components:      Result Value   Potassium 3.2 (*)    Glucose, Bld 116 (*)    Calcium 8.8 (*)    Albumin 3.4 (*)    All other components within normal limits  CBC - Abnormal; Notable for the following components:   RBC 5.98 (*)    MCV 73.4 (*)    MCH 22.4 (*)    All other components within normal limits  URINALYSIS, ROUTINE W REFLEX MICROSCOPIC - Abnormal; Notable for the following components:   APPearance HAZY (*)  Leukocytes,Ua MODERATE (*)    All other components within normal limits  LIPASE, BLOOD    EKG None  Radiology No results found.  Procedures Procedures (including critical care time)  Medications Ordered in ED Medications  sodium chloride flush (NS) 0.9 % injection 3 mL (3 mLs Intravenous Not Given 11/14/19 1607)  ondansetron (ZOFRAN) injection 4 mg (4 mg Intravenous Given 11/14/19 1714)  sodium chloride 0.9 % bolus 1,000 mL (0 mLs Intravenous Stopped 11/14/19 1857)  prochlorperazine (COMPAZINE) injection 10 mg (10 mg Intravenous Given 11/14/19 1715)  diphenhydrAMINE (BENADRYL) injection 25 mg (25 mg Intravenous Given 11/14/19 1715)    ED Course  I have reviewed the triage vital signs and the nursing notes.  Pertinent labs & imaging results that were available during my care of the patient were reviewed by me and considered in my medical decision making (see chart for details).    MDM Rules/Calculators/A&P                         44 year old female with nausea, vomiting and headache for 4 days On presentation, the patient is alert and oriented, nontoxic-appearing, in no acute distress, with no gross neurological abnormalities, no dysarthria, with no increased work of breathing.  Vitals overall reassuring here in the ER.  Physical exam with soft nontender abdomen, no gross neurological abnormalities noted.  Suspicion for surgical abdomen such as appendicitis, ovarian  torsion, diverticulitis, cholecystitis, etc. and intracranial pathology such as stroke, bleed low.  I personally reviewed her lab work which showed a CBC without leukocytosis, normal hemoglobin of 13.4.  Lipase normal, CMP with mild hypokalemia of 3.2, normal creatinine and liver function enzymes.  UA without evidence of UTI or blood.  Given fairly benign abdominal physical exam and neurologic exam, I do not think she requires any CT imaging at this time for further work-up.  Patient was given fluids, Zofran, and a migraine cocktail.  On reevaluation, she notes significant improvement in her headache, and improvement in her nausea as well.  She tolerated p.o. fluid challenge well, and was even able to eat a few bites of a cracker without vomiting.  I encouraged her to to drink plenty of fluids, eat a bland food diet, and will provide Zofran.  Return precautions discussed.  All the patient's questions have been answered to her satisfaction, she voices understanding is agreeable to this plan.  At this stage in the ED course, the patient has been medically screened and is stable for discharge.    Final Clinical Impression(s) / ED Diagnoses Final diagnoses:  Nausea and vomiting, intractability of vomiting not specified, unspecified vomiting type    Rx / DC Orders ED Discharge Orders         Ordered    ondansetron (ZOFRAN) 4 MG tablet  Every 6 hours     Discontinue  Reprint     11/14/19 1935           Lyndel Safe 11/14/19 1949    Drenda Freeze, MD 11/15/19 2018

## 2019-11-14 NOTE — ED Notes (Signed)
Verbalized understanding of DC instructions, Rx, follow up care.  Work note provided

## 2019-11-14 NOTE — Discharge Instructions (Signed)
Please make sure to drink plenty of fluids, start introducing foods in the bland food diet as described in the handout.  I have provided some nausea medications for you as well if needed.  Return to the ER if your symptoms worsen.

## 2019-11-14 NOTE — ED Triage Notes (Signed)
Pt. Stated, Ive had N/V with headache since July 14

## 2019-11-14 NOTE — ED Notes (Signed)
Pt tolerating PO fluids well

## 2020-01-19 ENCOUNTER — Ambulatory Visit (HOSPITAL_COMMUNITY)
Admission: EM | Admit: 2020-01-19 | Discharge: 2020-01-19 | Disposition: A | Discharge status: Home / Self Care | Payer: BLUE CROSS/BLUE SHIELD | Attending: Family Medicine | Admitting: Family Medicine

## 2020-01-19 ENCOUNTER — Other Ambulatory Visit: Payer: Self-pay

## 2020-01-19 ENCOUNTER — Encounter (HOSPITAL_COMMUNITY): Payer: Self-pay

## 2020-01-19 DIAGNOSIS — M222X1 Patellofemoral disorders, right knee: Secondary | ICD-10-CM

## 2020-01-19 DIAGNOSIS — M2142 Flat foot [pes planus] (acquired), left foot: Secondary | ICD-10-CM

## 2020-01-19 DIAGNOSIS — M76822 Posterior tibial tendinitis, left leg: Secondary | ICD-10-CM

## 2020-01-19 DIAGNOSIS — M2141 Flat foot [pes planus] (acquired), right foot: Secondary | ICD-10-CM

## 2020-01-19 MED ORDER — IBUPROFEN 600 MG PO TABS: 600.0000 mg | ORAL_TABLET | Freq: Three times a day (TID) | ORAL | 0 refills | Status: DC | PRN

## 2020-01-19 NOTE — Discharge Instructions (Addendum)
Please try the brace  Please try ice  Please follow up with sports medicine
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
## 2020-01-28 ENCOUNTER — Telehealth (HOSPITAL_COMMUNITY): Payer: Self-pay | Admitting: Emergency Medicine

## 2020-01-28 NOTE — Telephone Encounter (Signed)
Returned patient's call about her visit 1 week ago.  States the pain meds are not working (ibuprofen) and she is not feeling better.  This RN encouraged her to follow up with sports medicine, per Dr. Raeford Razor note.  Patient verbalized understanding.

## 2021-04-28 IMAGING — MR MR ABDOMEN WO/W CM
13 of 23 series · 17 of 48 positions shown · IV contrast (18 MH)
Comparison: CT evaluation of 01/23/2019 and 01/24/2014

CLINICAL DATA: Enlarged uterus. Suspicion for multiple leiomyomata
but with marked interval enlargement since 4787.

EXAM:
MRI ABDOMEN and pelvis WITHOUT AND WITH CONTRAST
TECHNIQUE: Multiplanar multisequence MR imaging of the abdomen and pelvis was
performed both before and after the administration of intravenous
contrast. Note that the abdominal portion of the exam is quite
limited extending only through the top of the kidneys on most
sequences in order to encompass the markedly enlarged uterus.
CONTRAST:  7mL GADAVIST GADOBUTROL 1 MMOL/ML IV SOLN

[Series 3: T2 · coronal · 5.0mm · 0.66mm/px · 2 of 36 slices shown (1 of 3)]
[im 1/36]
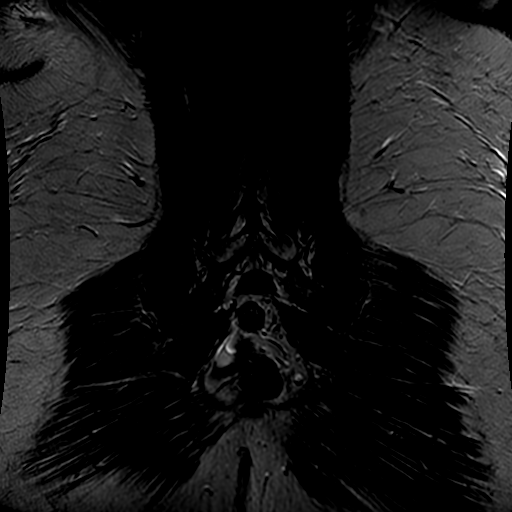
[im 36/36]
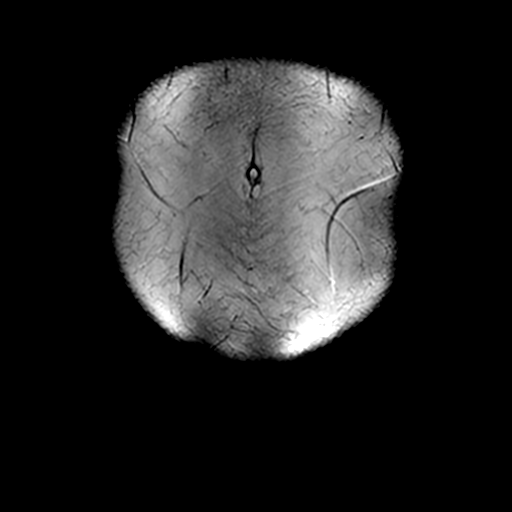

[Series 4: cor ssfse pelvis · coronal · 6.0mm · 0.82mm/px · 1 of 39 slices shown]
[im 1/39]
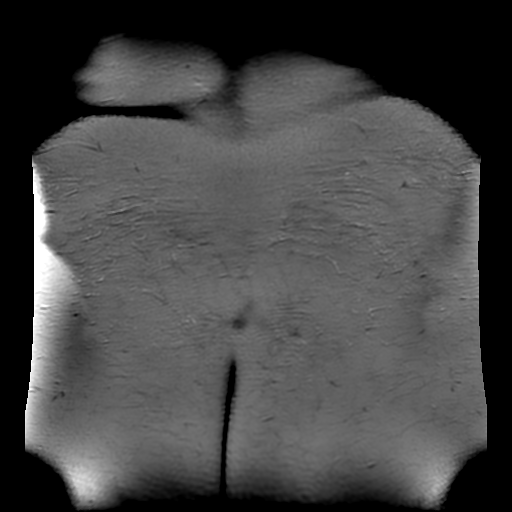

[Series 5: ax ssfse abd · axial · 4.0mm · 0.74mm/px · z∈[-7,+341]mm · 2 of 88 slices shown]
[im 1/88]
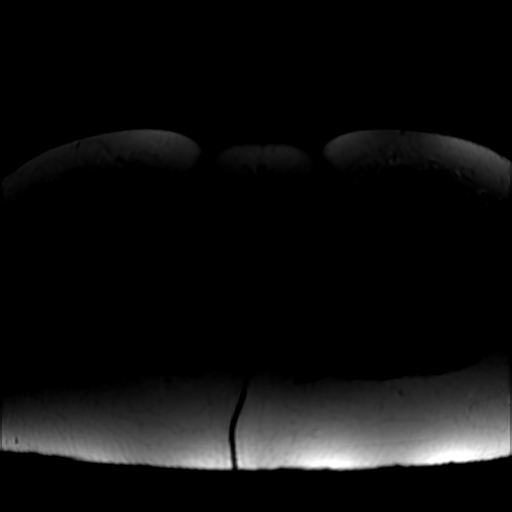
[im 88/88]
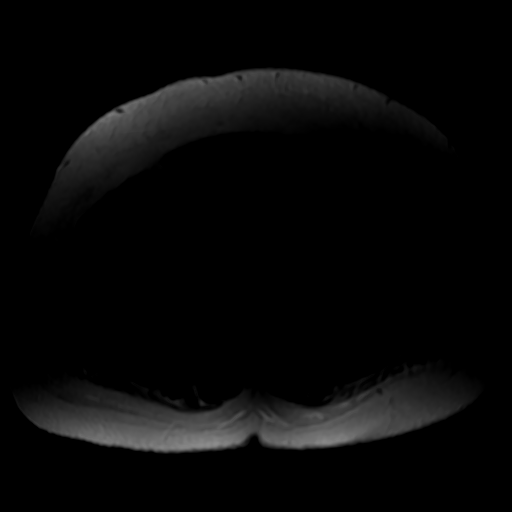

[Series 6: T2 · sagittal · 5.0mm · 0.66mm/px · 1 of 48 slices shown (2 of 3)]
[im 1/48]
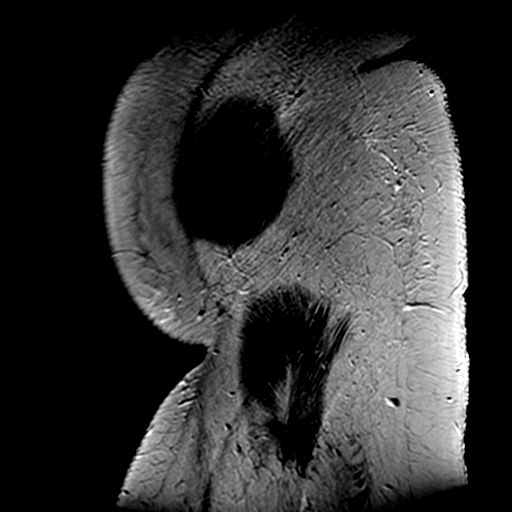

[Series 7: sag ssfse pelvis · sagittal · 6.0mm · 0.74mm/px · 1 of 43 slices shown]
[im 1/43]
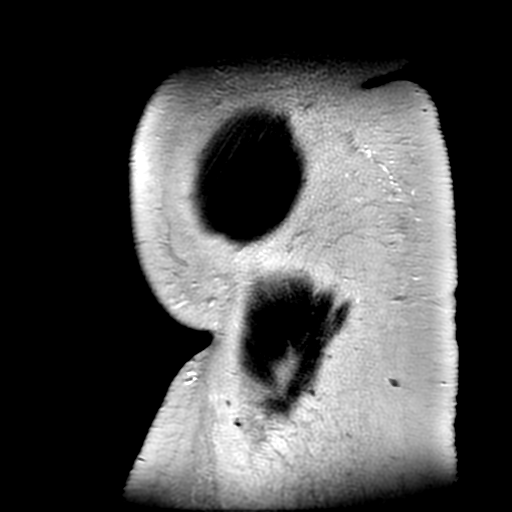

[Series 8: bSSFP · axial · 4.0mm · 0.74mm/px · z∈[-3,+341]mm · 2 of 87 slices shown (1 of 2)]
[im 1/87]
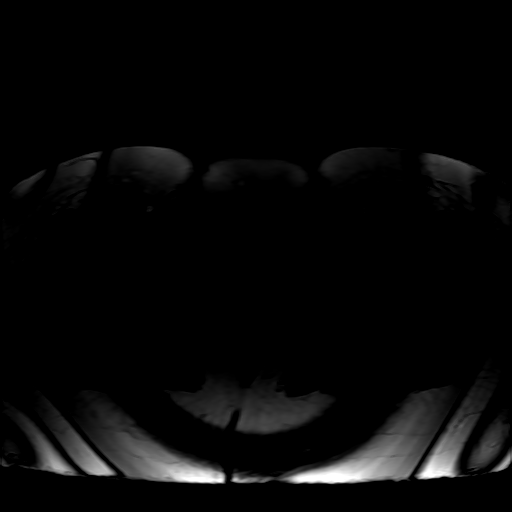
[im 87/87]
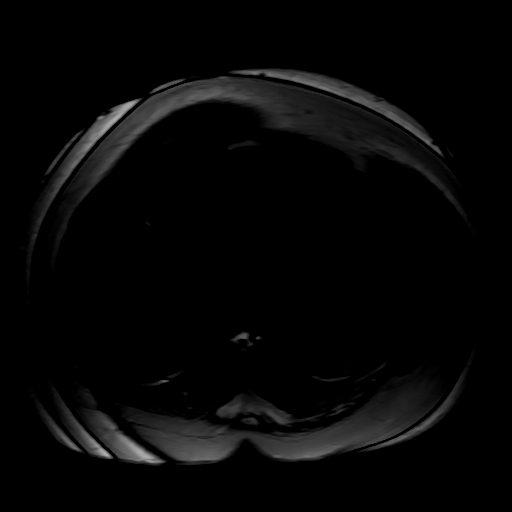

[Series 9: bSSFP · coronal · 6.0mm · 0.78mm/px · 1 of 39 slices shown (2 of 2)]
[im 1/39]
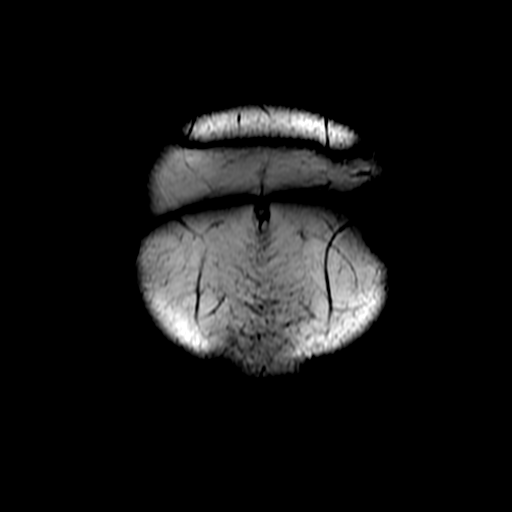

[Series 10: T2 · axial · 5.0mm · 0.66mm/px · 1 of 48 slices shown (3 of 3)]
[im 1/48]
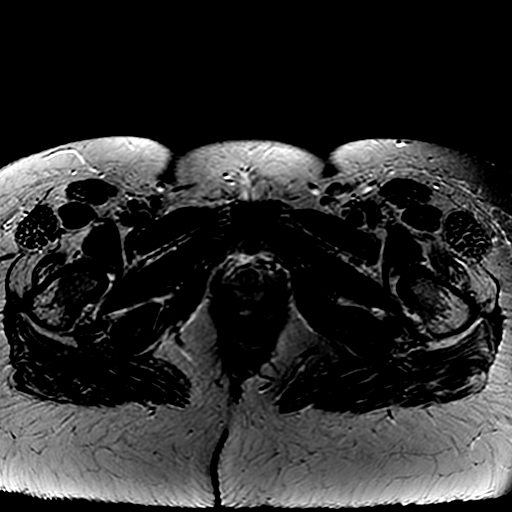

[Series 11: T2 fat-sat · axial · 5.0mm · 0.62mm/px · 1 of 48 slices shown]
[im 1/48]
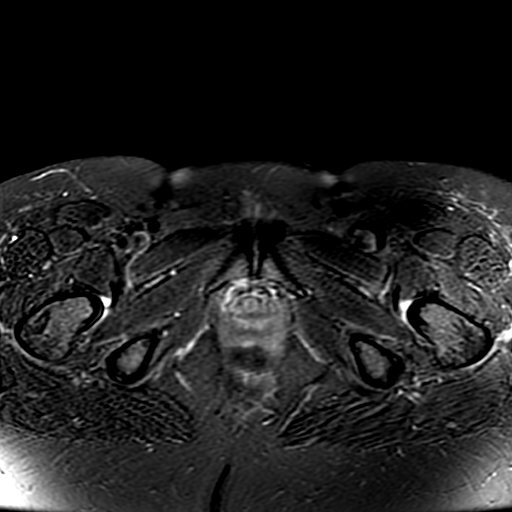

[Series 12: T1 fat-sat · axial · 5.0mm · 0.66mm/px · 1 of 48 slices shown (1 of 3)]
[im 1/48]
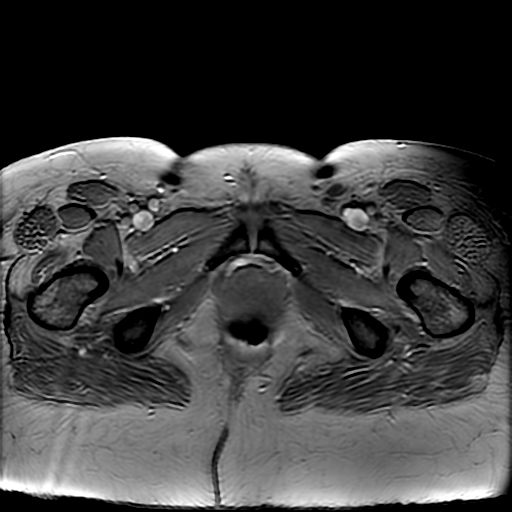

[Series 13: T1 fat-sat · coronal · 6.0mm · 0.82mm/px · 1 of 39 slices shown (2 of 3)]
[im 1/39]
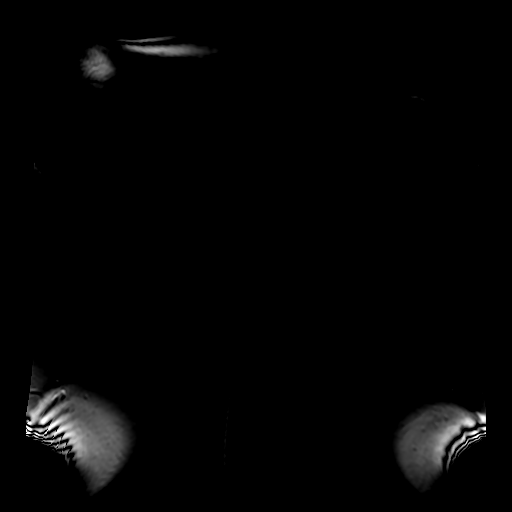

[Series 14: T1 fat-sat · axial · 5.0mm · 0.66mm/px · 1 of 48 slices shown (3 of 3)]
[im 1/48]
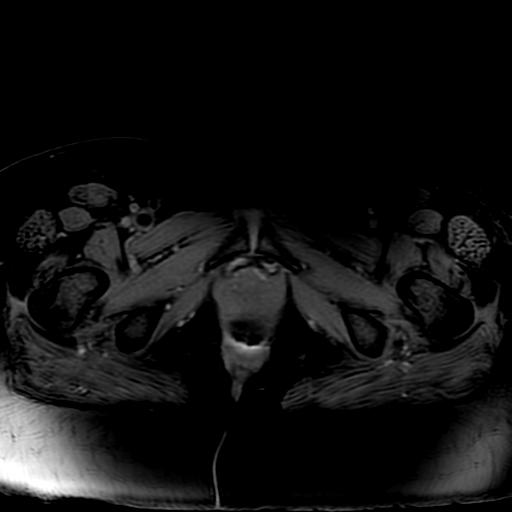

[Series 17: T1 dynamic · sagittal · 4.0mm · 0.66mm/px · 2 of 132 slices shown]
[im 1/132]
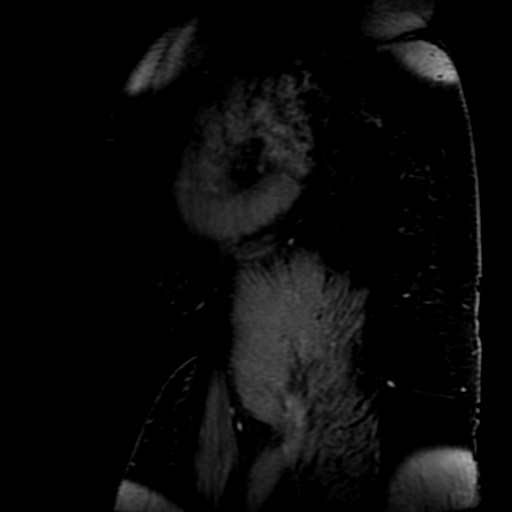
[im 66/132]
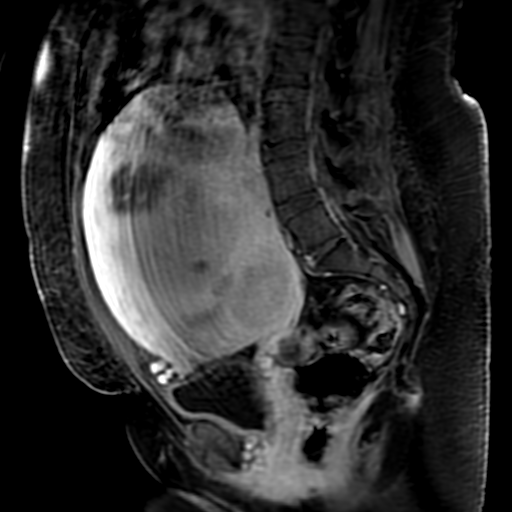

[17 of 48 positions shown; findings below may reference images not displayed]

FINDINGS: Reproductive: Enlarged, myomatous uterus with limited assessment
secondary to artifact generated by patient size and magnet,
technical factors uterus measures 24 x 12.5 x 23 cm cm with multiple
leiomyomas, most intramural with with large, notable
subserosal/pedunculated fibroids extending from the right uterine
body. (Greatest axial dimension of the uterus on the study of
01/24/2014 is approximately 17 cm.

Endometrial thickness may measure as thick as 2.5 cm though is
likely closer to 2.2 cm. The endometrium has a cystic appearance
with variable enhancement.

*Leiomyomata most displaying predominantly low T2 features with
mixed signal leiomyoma in the uterine fundus.
*Largest leiomyoma measuring 13 by 10.2 by 10.9 cm displaces
endometrium from left to right, intramural fibroid along the left
uterine body.
*Largest subserosal fibroid shows vascular stalking connection to
the uterus along the right lateral aspect of the mid uterine body
measuring 6.6 by 8.2 by 9.7 cm. No large submucosal fibroids are
demonstrated. At least 20 leiomyomata are present in the uterus.
Four of these leiomyomata display subserosal characteristics.

There is a small amount of fluid in the pelvis. Within the range of
physiologic

Ovaries are normal with a small cyst associated with the left ovary
measuring 3.0 x 3.1 cm.

No signs of adenopathy in the pelvis. No retroperitoneal adenopathy.

Lower chest: Not imaged aside from very limited imaging on some
coronal data sets.

Hepatobiliary: Hepatic cysts and hemangiomas better seen on recent
CT.

Pancreas: Grossly normal but incompletely assessed due to artifact
secondary to patient body habitus and other technical factors.

Spleen:  Imaged portions of the spleen are normal.

Adrenals/Urinary Tract: Grossly normal no only seen on a single
series stent and overall not well assessed.

Stomach/Bowel: Imaged portions of bowel are displaced in the setting
of a large myomatous uterus with numerous myomata as described
below.

Vascular/Lymphatic:  No signs of acute vascular process.

Other:  Body wall edema, no signs of hernia.

Musculoskeletal: No acute bone finding or destructive bone process.
IMPRESSION: *Cystic endometrial changes with endometrial thickening may
represent cystic endometrial hyperplasia or multiple polyps. Based
on imaging appearance this cannot be distinguished reliably from
endometrial cancer, consider further assessment with endometrial
sampling.
*Multiple leiomyomata throughout an enlarged uterus, uterus larger
than in 4787 but with marked enlargement previously.
*No signs of pelvic adenopathy.
*Please refer to CT evaluation for abdominal visceral findings.

## 2022-11-20 ENCOUNTER — Other Ambulatory Visit: Payer: Self-pay

## 2022-11-20 ENCOUNTER — Encounter (HOSPITAL_COMMUNITY): Payer: Self-pay | Admitting: Emergency Medicine

## 2022-11-20 ENCOUNTER — Ambulatory Visit (HOSPITAL_COMMUNITY)
Admission: EM | Admit: 2022-11-20 | Discharge: 2022-11-20 | Disposition: A | Discharge status: Home / Self Care | Payer: 59 | Attending: Family Medicine | Admitting: Family Medicine

## 2022-11-20 DIAGNOSIS — J069 Acute upper respiratory infection, unspecified: Secondary | ICD-10-CM | POA: Insufficient documentation

## 2022-11-20 DIAGNOSIS — E860 Dehydration: Secondary | ICD-10-CM | POA: Insufficient documentation

## 2022-11-20 DIAGNOSIS — R112 Nausea with vomiting, unspecified: Secondary | ICD-10-CM | POA: Diagnosis not present

## 2022-11-20 DIAGNOSIS — R197 Diarrhea, unspecified: Secondary | ICD-10-CM | POA: Insufficient documentation

## 2022-11-20 DIAGNOSIS — Z1152 Encounter for screening for COVID-19: Secondary | ICD-10-CM | POA: Insufficient documentation

## 2022-11-20 DIAGNOSIS — K529 Noninfective gastroenteritis and colitis, unspecified: Secondary | ICD-10-CM

## 2022-11-20 LAB — CBC
HCT: 47.3 % — ABNORMAL HIGH (ref 36.0–46.0)
Hemoglobin: 14.1 g/dL (ref 12.0–15.0)
MCH: 21.9 pg — ABNORMAL LOW (ref 26.0–34.0)
MCHC: 29.8 g/dL — ABNORMAL LOW (ref 30.0–36.0)
MCV: 73.3 fL — ABNORMAL LOW (ref 80.0–100.0)
Platelets: 477 K/uL — ABNORMAL HIGH (ref 150–400)
RBC: 6.45 MIL/uL — ABNORMAL HIGH (ref 3.87–5.11)
RDW: 17 % — ABNORMAL HIGH (ref 11.5–15.5)
WBC: 14.1 K/uL — ABNORMAL HIGH (ref 4.0–10.5)
nRBC: 0 % (ref 0.0–0.2)

## 2022-11-20 LAB — BASIC METABOLIC PANEL WITH GFR
Anion gap: 17 — ABNORMAL HIGH (ref 5–15)
BUN: 8 mg/dL (ref 6–20)
CO2: 25 mmol/L (ref 22–32)
Calcium: 9.9 mg/dL (ref 8.9–10.3)
Chloride: 98 mmol/L (ref 98–111)
Creatinine, Ser: 0.88 mg/dL (ref 0.44–1.00)
GFR, Estimated: >60 mL/min
Glucose, Bld: 102 mg/dL — ABNORMAL HIGH (ref 70–99)
Potassium: 2.9 mmol/L — ABNORMAL LOW (ref 3.5–5.1)
Sodium: 140 mmol/L (ref 135–145)

## 2022-11-20 MED ORDER — ONDANSETRON 4 MG PO TBDP
4.0000 mg | ORAL_TABLET | Freq: Once | ORAL | Status: AC
  Administered 2022-11-20: 4 mg via ORAL

## 2022-11-20 MED ORDER — ONDANSETRON 4 MG PO TBDP: 4.0000 mg | ORAL_TABLET | Freq: Three times a day (TID) | ORAL | 0 refills | Status: DC | PRN

## 2022-11-20 MED ORDER — ONDANSETRON 4 MG PO TBDP
ORAL_TABLET | ORAL | Status: AC
  Filled 2022-11-20: qty 1

## 2022-11-20 NOTE — Discharge Instructions (Addendum)
Ondansetron dissolved in the mouth every 8 hours as needed for nausea or vomiting. Clear liquids(water, gatorade/pedialyte, ginger ale/sprite, chicken broth/soup) and bland things(crackers/toast, rice, potato, bananas) to eat. Avoid acidic foods like lemon/lime/orange/tomato, and avoid greasy/spicy foods. We have given you 1 dose of this medication today   You have been swabbed for COVID, and the test will result in the next 24 hours. Our staff will call you if positive. If the COVID test is positive, you should quarantine until you are fever free for 24 hours and you are starting to feel better, and then take added precautions for the next 5 days, such as physical distancing/wearing a mask and good hand hygiene/washing.  You can take Tylenol as needed for the headache and pain.  If you get weaker or not able to keep fluids down, please consider going to the emergency room for further evaluation

## 2022-11-20 NOTE — ED Provider Notes (Addendum)
[facility]    CSN: [id number] Arrival date & time: 11/20/22  1320      History   Chief Complaint Chief Complaint  Patient presents with   Emesis    HPI Rebecca Payne is a 47 y.o. female.    Emesis Here for nausea and vomiting and diarrhea and cough and congestion.  She first started feeling ill on July 20.  She had about 4 episodes of emesis and 1 episode of diarrhea that day.  Since then she has not had any more vomiting but continues to be nauseated.  No bowel movement since July 20.  She has had some headache and also had some cough and congestion since all this began.  Not really short of breath.  No abdominal pain no dysuria.  She is allergic to codeine.  She has had a hysterectomy  She does not take any medication for chronic conditions.  Past Medical History:  Diagnosis Date   Anemia    Asthma    Blood transfusion without reported diagnosis    Fibroids 02/23/2019   Menorrhagia 06/11/2013    Patient Active Problem List   Diagnosis Date Noted   Post-operative state 07/07/2019   Symptomatic anemia 01/22/2019   Vitamin B12 deficiency anemia 06/12/2013   Iron deficiency anemia due to chronic blood loss 06/11/2013    Past Surgical History:  Procedure Laterality Date   ABDOMINAL HYSTERECTOMY  07/07/2019   HYSTERECTOMY ABDOMINAL WITH SALPINGECTOMY Bilateral 07/07/2019   Procedure: HYSTERECTOMY ABDOMINAL WITH BILATERAL SALPINGECTOMY AND FROZEN BIOPSY OF OMENTAL LESION;  Surgeon: Hermina Staggers, MD;  Location: [facility];  Service: Gynecology;  Laterality: Bilateral;  TAP BLOCK REQUESTED    OB History     Gravida  1   Para      Term      Preterm      AB  1   Living  0      SAB  1   IAB      Ectopic      Multiple      Live Births               Home Medications    Prior to Admission medications   Medication Sig Start Date End Date Taking? Authorizing Provider  ondansetron (ZOFRAN-ODT) 4 MG disintegrating tablet Take 1 tablet  (4 mg total) by mouth every 8 (eight) hours as needed for nausea or vomiting. 11/20/22  Yes Zenia Resides, MD  Multiple Vitamin (MULTIVITAMIN) tablet Take 1 tablet by mouth daily.    [provider]    Family History History reviewed. No pertinent family history.  Social History Social History   Tobacco Use   Smoking status: Never   Smokeless tobacco: Never  Vaping Use   Vaping status: Never Used  Substance Use Topics   Alcohol use: No   Drug use: No     Allergies   Influenza vaccines, Codeine, and Lasix [furosemide]   Review of Systems Review of Systems  Gastrointestinal:  Positive for vomiting.     Physical Exam Triage Vital Signs ED Triage Vitals  Encounter Vitals Group     BP 11/20/22 1450 135/86     Systolic BP Percentile --      Diastolic BP Percentile --      Pulse Rate 11/20/22 1450 (!) 130     Resp 11/20/22 1450 20     Temp 11/20/22 1450 (!) 97.4 F (36.3 C)     Temp Source 11/20/22 1450  Oral     SpO2 11/20/22 1450 97 %     Weight --      Height --      Head Circumference --      Peak Flow --      Pain Score 11/20/22 1448 8     Pain Loc --      Pain Education --      Exclude from Growth Chart --    No data found.  Updated Vital Signs BP 135/86 (BP Location: Left Arm) Comment (BP Location): regular cuff , left forearm  Pulse (!) 130   Temp (!) 97.4 F (36.3 C) (Oral)   Resp 20   LMP 03/12/2019 (Approximate)   SpO2 97%   Visual Acuity Right Eye Distance:   Left Eye Distance:   Bilateral Distance:    Right Eye Near:   Left Eye Near:    Bilateral Near:     Physical Exam Vitals reviewed.  Constitutional:      General: She is not in acute distress.    Appearance: She is not ill-appearing, toxic-appearing or diaphoretic.  HENT:     Nose: Congestion present.     Mouth/Throat:     Mouth: Mucous membranes are moist.     Pharynx: No oropharyngeal exudate or posterior oropharyngeal erythema.  Eyes:     Extraocular  Movements: Extraocular movements intact.     Conjunctiva/sclera: Conjunctivae normal.     Pupils: Pupils are equal, round, and reactive to light.  Cardiovascular:     Rate and Rhythm: Regular rhythm. Tachycardia present.     Heart sounds: No murmur heard. Pulmonary:     Effort: Pulmonary effort is normal. No respiratory distress.     Breath sounds: No stridor. No wheezing, rhonchi or rales.  Abdominal:     General: Bowel sounds are normal.     Palpations: Abdomen is soft.     Tenderness: There is no abdominal tenderness. There is no guarding.  Musculoskeletal:     Cervical back: Neck supple.  Lymphadenopathy:     Cervical: No cervical adenopathy.  Skin:    Capillary Refill: Capillary refill takes less than 2 seconds.     Coloration: Skin is not jaundiced or pale.  Neurological:     General: No focal deficit present.     Mental Status: She is alert and oriented to person, place, and time.  Psychiatric:        Behavior: Behavior normal.      UC Treatments / Results  Labs (all labs ordered are listed, but only abnormal results are displayed) Labs Reviewed  SARS CORONAVIRUS 2 (TAT 6-24 HRS)  CBC  BASIC METABOLIC PANEL    EKG   Radiology No results found.  Procedures Procedures (including critical care time)  Medications Ordered in UC Medications  ondansetron (ZOFRAN-ODT) disintegrating tablet 4 mg (has no administration in time range)    Initial Impression / Assessment and Plan / UC Course  I have reviewed the triage vital signs and the nursing notes.  Pertinent labs & imaging results that were available during my care of the patient were reviewed by me and considered in my medical decision making (see chart for details).       I do think she is mildly dehydrated.  Blood pressure normal at this time, so I think she can push oral fluids.  Zofran is administered here in the clinic and prescription of same is sent to the pharmacy.  COVID swab was done, and she  is a candidate for Paxlovid if she is positive.  Her last EGFR was greater than 60  She will proceed to the emergency room if she is worsening anyway.  Final Clinical Impressions(s) / UC Diagnoses   Final diagnoses:  Viral URI  Nausea vomiting and diarrhea  Mild dehydration     Discharge Instructions      Ondansetron dissolved in the mouth every 8 hours as needed for nausea or vomiting. Clear liquids(water, gatorade/pedialyte, ginger ale/sprite, chicken broth/soup) and bland things(crackers/toast, rice, potato, bananas) to eat. Avoid acidic foods like lemon/lime/orange/tomato, and avoid greasy/spicy foods. We have given you 1 dose of this medication today   You have been swabbed for COVID, and the test will result in the next 24 hours. Our staff will call you if positive. If the COVID test is positive, you should quarantine until you are fever free for 24 hours and you are starting to feel better, and then take added precautions for the next 5 days, such as physical distancing/wearing a mask and good hand hygiene/washing.  You can take Tylenol as needed for the headache and pain.  If you get weaker or not able to keep fluids down, please consider going to the emergency room for further evaluation     ED Prescriptions     Medication Sig Dispense Auth. Provider   ondansetron (ZOFRAN-ODT) 4 MG disintegrating tablet Take 1 tablet (4 mg total) by mouth every 8 (eight) hours as needed for nausea or vomiting. 10 tablet Marlinda Mike Janace Aris, MD      PDMP not reviewed this encounter.   Zenia Resides, MD 11/20/22 1518    Zenia Resides, MD 11/20/22 [phone]

## 2022-11-20 NOTE — ED Triage Notes (Addendum)
Headache, nausea, vomiting and diarrhea ( last episode was Saturday) for 3 days. .  No vomiting today, no diarrhea today  Has taken pepto bismal

## 2022-11-21 LAB — SARS CORONAVIRUS 2 (TAT 6-24 HRS): SARS Coronavirus 2: NEGATIVE

## 2023-05-27 ENCOUNTER — Ambulatory Visit (HOSPITAL_COMMUNITY)
Admission: EM | Admit: 2023-05-27 | Discharge: 2023-05-27 | Disposition: A | Discharge status: Home / Self Care | Payer: 59 | Attending: Family Medicine | Admitting: Family Medicine

## 2023-05-27 ENCOUNTER — Encounter (HOSPITAL_COMMUNITY): Payer: Self-pay

## 2023-05-27 DIAGNOSIS — J069 Acute upper respiratory infection, unspecified: Secondary | ICD-10-CM

## 2023-05-27 MED ORDER — METHYLPREDNISOLONE 4 MG PO TBPK: ORAL_TABLET | ORAL | 0 refills | Status: AC

## 2023-05-27 NOTE — Discharge Instructions (Signed)
Please follow instructions on your Medrol Dosepak.

## 2023-05-27 NOTE — ED Provider Notes (Signed)
[facility]    CSN: [id number] Arrival date & time: 05/27/23  5638      History   Chief Complaint Chief Complaint  Patient presents with   Cough    HPI Rebecca Payne is a 48 y.o. female.   Patient presenting with 2-week history of cough and congestion.  Patient states that the cough is worse at night.  Patient notes that sometimes she feels cold but denies any fevers or specific chills.  Patient denies any sweats.  Patient notes that her cough is keeping her up at night and is causing her concern as she is unable to work due to lack of sleep.  Patient notes no other concerns at this time.  Patient does have productive sputum and states that is clear in nature.     Cough   Past Medical History:  Diagnosis Date   Anemia    Asthma    Blood transfusion without reported diagnosis    Fibroids 02/23/2019   Menorrhagia 06/11/2013    Patient Active Problem List   Diagnosis Date Noted   Post-operative state 07/07/2019   Symptomatic anemia 01/22/2019   Vitamin B12 deficiency anemia 06/12/2013   Iron deficiency anemia due to chronic blood loss 06/11/2013    Past Surgical History:  Procedure Laterality Date   ABDOMINAL HYSTERECTOMY  07/07/2019   HYSTERECTOMY ABDOMINAL WITH SALPINGECTOMY Bilateral 07/07/2019   Procedure: HYSTERECTOMY ABDOMINAL WITH BILATERAL SALPINGECTOMY AND FROZEN BIOPSY OF OMENTAL LESION;  Surgeon: Hermina Staggers, MD;  Location: [facility];  Service: Gynecology;  Laterality: Bilateral;  TAP BLOCK REQUESTED    OB History     Gravida  1   Para      Term      Preterm      AB  1   Living  0      SAB  1   IAB      Ectopic      Multiple      Live Births               Home Medications    Prior to Admission medications   Medication Sig Start Date End Date Taking? Authorizing Provider  methylPREDNISolone (MEDROL DOSEPAK) 4 MG TBPK tablet Follow instructions on package 05/27/23  Yes Brenton Grills, MD  Multiple Vitamin (MULTIVITAMIN)  tablet Take 1 tablet by mouth daily.    [provider]  ondansetron (ZOFRAN-ODT) 4 MG disintegrating tablet Take 1 tablet (4 mg total) by mouth every 8 (eight) hours as needed for nausea or vomiting. 11/20/22   Zenia Resides, MD    Family History History reviewed. No pertinent family history.  Social History Social History   Tobacco Use   Smoking status: Never   Smokeless tobacco: Never  Vaping Use   Vaping status: Never Used  Substance Use Topics   Alcohol use: No   Drug use: No     Allergies   Influenza vaccines, Codeine, and Lasix [furosemide]   Review of Systems Review of Systems  Respiratory:  Positive for cough.      Physical Exam Triage Vital Signs ED Triage Vitals  Encounter Vitals Group     BP 05/27/23 1113 (!) 143/88     Systolic BP Percentile --      Diastolic BP Percentile --      Pulse Rate 05/27/23 1113 (!) 103     Resp 05/27/23 1113 18     Temp 05/27/23 1113 97.9 F (36.6 C)  Temp Source 05/27/23 1113 Oral     SpO2 05/27/23 1113 96 %     Weight --      Height --      Head Circumference --      Peak Flow --      Pain Score 05/27/23 1112 0     Pain Loc --      Pain Education --      Exclude from Growth Chart --    No data found.  Updated Vital Signs BP (!) 143/88 (BP Location: Left Arm)   Pulse (!) 103   Temp 97.9 F (36.6 C) (Oral)   Resp 18   LMP 03/12/2019 (Approximate)   SpO2 96%   Visual Acuity Right Eye Distance:   Left Eye Distance:   Bilateral Distance:    Right Eye Near:   Left Eye Near:    Bilateral Near:     Physical Exam Constitutional:      Appearance: Normal appearance.  Cardiovascular:     Rate and Rhythm: Regular rhythm. Tachycardia present.     Pulses: Normal pulses.     Heart sounds: Normal heart sounds.  Pulmonary:     Effort: Pulmonary effort is normal. No respiratory distress.     Breath sounds: Wheezing present.  Neurological:     Mental Status: She is alert.      UC Treatments  / Results  Labs (all labs ordered are listed, but only abnormal results are displayed) Labs Reviewed - No data to display  EKG   Radiology No results found.  Procedures Procedures (including critical care time)  Medications Ordered in UC Medications - No data to display  Initial Impression / Assessment and Plan / UC Course  I have reviewed the triage vital signs and the nursing notes.  Pertinent labs & imaging results that were available during my care of the patient were reviewed by me and considered in my medical decision making (see chart for details).     Patient presenting with likely viral URI with persistent cough, discussed with patient use of steroid given her lack of sleep from cough.  Patient would like to go ahead and do a Medrol Dosepak.  Advised patient that if she starts to develop worsening symptoms or if her symptoms do not improve then she needs to come back and at least 5 to 6 days.  Patient is standing and agreeable with plan. Final Clinical Impressions(s) / UC Diagnoses   Final diagnoses:  Acute upper respiratory infection     Discharge Instructions      Please follow instructions on your Medrol Dosepak.     ED Prescriptions     Medication Sig Dispense Auth. Provider   methylPREDNISolone (MEDROL DOSEPAK) 4 MG TBPK tablet Follow instructions on package 1 each Brenton Grills, MD      PDMP not reviewed this encounter.   Brenton Grills, MD 05/27/23 1311

## 2023-05-27 NOTE — ED Triage Notes (Signed)
Pt states cough for the past 2 weeks.

## 2023-06-07 ENCOUNTER — Ambulatory Visit (HOSPITAL_COMMUNITY)
Admission: EM | Admit: 2023-06-07 | Discharge: 2023-06-07 | Disposition: A | Discharge status: Home / Self Care | Payer: 59

## 2023-06-07 ENCOUNTER — Encounter (HOSPITAL_COMMUNITY): Payer: Self-pay

## 2023-06-07 DIAGNOSIS — J4521 Mild intermittent asthma with (acute) exacerbation: Secondary | ICD-10-CM | POA: Diagnosis not present

## 2023-06-07 DIAGNOSIS — R197 Diarrhea, unspecified: Secondary | ICD-10-CM | POA: Diagnosis not present

## 2023-06-07 DIAGNOSIS — R112 Nausea with vomiting, unspecified: Secondary | ICD-10-CM

## 2023-06-07 MED ORDER — ONDANSETRON 4 MG PO TBDP: 4.0000 mg | ORAL_TABLET | Freq: Three times a day (TID) | ORAL | 0 refills | Status: AC | PRN

## 2023-06-07 MED ORDER — ONDANSETRON 4 MG PO TBDP
4.0000 mg | ORAL_TABLET | Freq: Once | ORAL | Status: AC
  Administered 2023-06-07: 4 mg via ORAL

## 2023-06-07 MED ORDER — PREDNISONE 20 MG PO TABS: 40.0000 mg | ORAL_TABLET | Freq: Every day | ORAL | 0 refills | Status: AC

## 2023-06-07 MED ORDER — ONDANSETRON 4 MG PO TBDP
ORAL_TABLET | ORAL | Status: AC
  Filled 2023-06-07: qty 1

## 2023-06-07 NOTE — ED Provider Notes (Signed)
 [facility]    CSN: [id number] Arrival date & time: 06/07/23  1217      History   Chief Complaint Chief Complaint  Patient presents with   Cough   Emesis    HPI Rebecca Payne is a 48 y.o. female.   Patient presents to clinic complaining of nausea, vomiting and diarrhea since early Thursday morning around 2 AM.  She had vomiting and diarrhea throughout the day.  Last emesis early this morning.  Last diarrhea while in clinic.  Has been trying water, soups and broths without much improvement.  Reports she has a history of asthma and has had a cough, wheezing and shortness of breath for the past 2 weeks.  No fever body aches.  Has tried Delsym .  Does have an albuterol  inhaler that she did use last week.  The history is provided by the patient and medical records.  Cough Emesis   Past Medical History:  Diagnosis Date   Anemia    Asthma    Blood transfusion without reported diagnosis    Fibroids 02/23/2019   Menorrhagia 06/11/2013    Patient Active Problem List   Diagnosis Date Noted   Post-operative state 07/07/2019   Symptomatic anemia 01/22/2019   Vitamin B12 deficiency anemia 06/12/2013   Iron deficiency anemia due to chronic blood loss 06/11/2013    Past Surgical History:  Procedure Laterality Date   ABDOMINAL HYSTERECTOMY  07/07/2019   HYSTERECTOMY ABDOMINAL WITH SALPINGECTOMY Bilateral 07/07/2019   Procedure: HYSTERECTOMY ABDOMINAL WITH BILATERAL SALPINGECTOMY AND FROZEN BIOPSY OF OMENTAL LESION;  Surgeon: Lorence Ozell CROME, MD;  Location: [facility] OR;  Service: Gynecology;  Laterality: Bilateral;  TAP BLOCK REQUESTED    OB History     Gravida  1   Para      Term      Preterm      AB  1   Living  0      SAB  1   IAB      Ectopic      Multiple      Live Births               Home Medications    Prior to Admission medications   Medication Sig Start Date End Date Taking? Authorizing Provider  albuterol  (VENTOLIN  HFA) 108 (90  Base) MCG/ACT inhaler Inhale into the lungs. 02/05/19  Yes [provider]  cyanocobalamin  (VITAMIN B12) 250 MCG tablet Take by mouth. 01/24/19  Yes [provider]  ferrous sulfate  325 (65 FE) MG tablet Take by mouth. 01/24/19  Yes [provider]  folic acid  (FOLVITE ) 1 MG tablet Take by mouth. 01/24/19  Yes [provider]  meloxicam (MOBIC) 15 MG tablet Take 1 tablet by mouth daily. 07/13/21  Yes [provider]  methylPREDNISolone  (MEDROL  DOSEPAK) 4 MG TBPK tablet Follow instructions on package 05/27/23  Yes Jha, Panav, MD  Multiple Vitamin (MULTIVITAMIN) tablet Take 1 tablet by mouth daily.   Yes [provider]  ondansetron  (ZOFRAN -ODT) 4 MG disintegrating tablet Take 1 tablet (4 mg total) by mouth every 8 (eight) hours as needed for nausea or vomiting. 06/07/23  Yes Annelisa Ryback  N, FNP  predniSONE  (DELTASONE ) 20 MG tablet Take 2 tablets (40 mg total) by mouth daily for 5 days. 06/07/23 06/12/23 Yes Dreama, Ma Munoz  N, FNP    Family History History reviewed. No pertinent family history.  Social History Social History   Tobacco Use   Smoking status: Never   Smokeless tobacco: Never  Vaping Use   Vaping status: Never Used  Substance Use Topics   Alcohol use: No   Drug use: No     Allergies   Influenza vaccines, Codeine, and Lasix [furosemide]   Review of Systems Review of Systems  Per HPI   Physical Exam Triage Vital Signs ED Triage Vitals  Encounter Vitals Group     BP 06/07/23 1424 (!) 124/92     Systolic BP Percentile --      Diastolic BP Percentile --      Pulse Rate 06/07/23 1424 (!) 114     Resp 06/07/23 1424 18     Temp 06/07/23 1424 98.2 F (36.8 C)     Temp Source 06/07/23 1424 Oral     SpO2 06/07/23 1424 97 %     Weight --      Height --      Head Circumference --      Peak Flow --      Pain Score 06/07/23 1420 0     Pain Loc --      Pain Education --      Exclude from Growth Chart --    No  data found.  Updated Vital Signs BP (!) 124/92 (BP Location: Left Arm)   Pulse (!) 114   Temp 98.2 F (36.8 C) (Oral)   Resp 18   LMP 03/12/2019 (Approximate)   SpO2 97%   Visual Acuity Right Eye Distance:   Left Eye Distance:   Bilateral Distance:    Right Eye Near:   Left Eye Near:    Bilateral Near:     Physical Exam Vitals and nursing note reviewed.  Constitutional:      Appearance: Normal appearance.  HENT:     Head: Normocephalic and atraumatic.     Right Ear: External ear normal.     Left Ear: External ear normal.     Nose: Nose normal.     Mouth/Throat:     Mouth: Mucous membranes are moist.  Eyes:     Conjunctiva/sclera: Conjunctivae normal.  Cardiovascular:     Rate and Rhythm: Normal rate and regular rhythm.     Heart sounds: Normal heart sounds. No murmur heard. Pulmonary:     Effort: Pulmonary effort is normal. No respiratory distress.     Breath sounds: Decreased breath sounds present.  Musculoskeletal:        General: Normal range of motion.  Skin:    General: Skin is warm and dry.  Neurological:     General: No focal deficit present.     Mental Status: She is alert and oriented to person, place, and time.  Psychiatric:        Mood and Affect: Mood normal.        Behavior: Behavior normal. Behavior is cooperative.      UC Treatments / Results  Labs (all labs ordered are listed, but only abnormal results are displayed) Labs Reviewed - No data to display  EKG   Radiology No results found.  Procedures Procedures (including critical care time)  Medications Ordered in UC Medications  ondansetron  (ZOFRAN -ODT) disintegrating tablet 4 mg (4 mg Oral Given 06/07/23 1519)    Initial Impression / Assessment and Plan / UC Course  I have reviewed the triage vital signs and the nursing notes.  Pertinent labs & imaging results that were available during my care of the patient were reviewed by me and considered in my medical decision making (see  chart for details).  Vitals and triage reviewed, patient is hemodynamically stable.  Lungs are vesicular, heart with regular rate and rhythm.  No abdominal pain.  Tolerating p.o. food and fluids in clinic after Zofran  administration.  Suspect viral gastroenteritis.  Patient is short of breath and with subjective wheezing, will treat with steroid burst for asthma exacerbation.  Plan of care, follow-up care return precautions given, no questions at this time.     Final Clinical Impressions(s) / UC Diagnoses   Final diagnoses:  Nausea vomiting and diarrhea  Mild intermittent asthma with acute exacerbation     Discharge Instructions      Use the Zofran  every 8 hours as needed for nausea and vomiting.  Ensure adequate fluid intake with at least 64 ounces of water.  You can also add ginger ale to help settle your stomach.  If this goes well you can progress to soups and broths.  After this you can do a bland diet such as bananas, rice, toast and applesauce.  Use your inhaler as needed for any wheezing or shortness of breath.  Start the oral steroids today and take them daily with breakfast.  Symptoms should improve over the next 5 to 7 days, if no improvement or any changes please return to clinic for reevaluation.      ED Prescriptions     Medication Sig Dispense Auth. Provider   predniSONE  (DELTASONE ) 20 MG tablet Take 2 tablets (40 mg total) by mouth daily for 5 days. 10 tablet Dreama, Keishia Ground  N, FNP   ondansetron  (ZOFRAN -ODT) 4 MG disintegrating tablet Take 1 tablet (4 mg total) by mouth every 8 (eight) hours as needed for nausea or vomiting. 20 tablet Dreama, Tequia Wolman  N, FNP      PDMP not reviewed this encounter.   Dreama, Ana Woodroof  N, FNP 06/07/23 1555

## 2023-06-07 NOTE — ED Triage Notes (Signed)
 Pt c/o of nausea, vomiting, and diarrhea since Thursday. Pt has also had a cough for 2 weeks.   Home Interventions: Delsym , Steroids

## 2023-06-07 NOTE — Discharge Instructions (Signed)
 Use the Zofran  every 8 hours as needed for nausea and vomiting.  Ensure adequate fluid intake with at least 64 ounces of water.  You can also add ginger ale to help settle your stomach.  If this goes well you can progress to soups and broths.  After this you can do a bland diet such as bananas, rice, toast and applesauce.  Use your inhaler as needed for any wheezing or shortness of breath.  Start the oral steroids today and take them daily with breakfast.  Symptoms should improve over the next 5 to 7 days, if no improvement or any changes please return to clinic for reevaluation.
